# Patient Record
Sex: Male | Born: 1950
Health system: Southern US, Community
[De-identification: ages and names within clinical notes are randomized; demographics above are authoritative.]

## PROBLEM LIST (undated history)

## (undated) DIAGNOSIS — E785 Hyperlipidemia, unspecified: Secondary | ICD-10-CM

## (undated) DIAGNOSIS — E039 Hypothyroidism, unspecified: Secondary | ICD-10-CM

## (undated) DIAGNOSIS — I456 Pre-excitation syndrome: Secondary | ICD-10-CM

## (undated) DIAGNOSIS — N189 Chronic kidney disease, unspecified: Secondary | ICD-10-CM

## (undated) DIAGNOSIS — R55 Syncope and collapse: Secondary | ICD-10-CM

## (undated) DIAGNOSIS — K219 Gastro-esophageal reflux disease without esophagitis: Secondary | ICD-10-CM

## (undated) DIAGNOSIS — I4891 Unspecified atrial fibrillation: Secondary | ICD-10-CM

## (undated) HISTORY — DX: Syncope and collapse: R55

## (undated) HISTORY — DX: Gastro-esophageal reflux disease without esophagitis: K21.9

## (undated) HISTORY — DX: Chronic kidney disease, unspecified: N18.9

## (undated) HISTORY — PX: HERNIA REPAIR: SHX51

## (undated) HISTORY — DX: Hyperlipidemia, unspecified: E78.5

## (undated) HISTORY — DX: Unspecified atrial fibrillation: I48.91

## (undated) HISTORY — DX: Pre-excitation syndrome: I45.6

## (undated) HISTORY — DX: Hypothyroidism, unspecified: E03.9

## (undated) HISTORY — PX: ROTATOR CUFF REPAIR: SHX139

## (undated) HISTORY — PX: OTHER SURGICAL HISTORY: SHX169

---

## 2004-07-19 ENCOUNTER — Ambulatory Visit: Payer: Self-pay | Admitting: Internal Medicine

## 2004-07-22 ENCOUNTER — Ambulatory Visit: Payer: Self-pay | Admitting: Cardiology

## 2015-02-09 DIAGNOSIS — Z79899 Other long term (current) drug therapy: Secondary | ICD-10-CM

## 2015-02-09 DIAGNOSIS — I422 Other hypertrophic cardiomyopathy: Secondary | ICD-10-CM

## 2015-02-09 DIAGNOSIS — E785 Hyperlipidemia, unspecified: Secondary | ICD-10-CM | POA: Insufficient documentation

## 2015-02-09 DIAGNOSIS — I10 Essential (primary) hypertension: Secondary | ICD-10-CM

## 2015-02-09 DIAGNOSIS — I480 Paroxysmal atrial fibrillation: Secondary | ICD-10-CM

## 2015-02-09 HISTORY — DX: Other long term (current) drug therapy: Z79.899

## 2015-02-09 HISTORY — DX: Essential (primary) hypertension: I10

## 2015-02-09 HISTORY — DX: Paroxysmal atrial fibrillation: I48.0

## 2015-02-09 HISTORY — DX: Other hypertrophic cardiomyopathy: I42.2

## 2016-10-07 DIAGNOSIS — S0500XA Injury of conjunctiva and corneal abrasion without foreign body, unspecified eye, initial encounter: Secondary | ICD-10-CM | POA: Diagnosis not present

## 2017-09-05 DIAGNOSIS — Z79899 Other long term (current) drug therapy: Secondary | ICD-10-CM | POA: Diagnosis not present

## 2017-09-05 DIAGNOSIS — Z125 Encounter for screening for malignant neoplasm of prostate: Secondary | ICD-10-CM | POA: Diagnosis not present

## 2017-09-05 DIAGNOSIS — E039 Hypothyroidism, unspecified: Secondary | ICD-10-CM | POA: Diagnosis not present

## 2017-11-17 DIAGNOSIS — E039 Hypothyroidism, unspecified: Secondary | ICD-10-CM | POA: Diagnosis not present

## 2017-11-17 DIAGNOSIS — E782 Mixed hyperlipidemia: Secondary | ICD-10-CM | POA: Diagnosis not present

## 2017-11-17 DIAGNOSIS — J309 Allergic rhinitis, unspecified: Secondary | ICD-10-CM | POA: Diagnosis not present

## 2017-11-17 DIAGNOSIS — R972 Elevated prostate specific antigen [PSA]: Secondary | ICD-10-CM | POA: Diagnosis not present

## 2017-12-05 DIAGNOSIS — Z1339 Encounter for screening examination for other mental health and behavioral disorders: Secondary | ICD-10-CM | POA: Diagnosis not present

## 2017-12-05 DIAGNOSIS — E039 Hypothyroidism, unspecified: Secondary | ICD-10-CM | POA: Diagnosis not present

## 2017-12-05 DIAGNOSIS — J309 Allergic rhinitis, unspecified: Secondary | ICD-10-CM | POA: Diagnosis not present

## 2017-12-05 DIAGNOSIS — Z79899 Other long term (current) drug therapy: Secondary | ICD-10-CM | POA: Diagnosis not present

## 2017-12-05 DIAGNOSIS — R0982 Postnasal drip: Secondary | ICD-10-CM | POA: Diagnosis not present

## 2017-12-19 DIAGNOSIS — N401 Enlarged prostate with lower urinary tract symptoms: Secondary | ICD-10-CM | POA: Diagnosis not present

## 2017-12-19 DIAGNOSIS — R972 Elevated prostate specific antigen [PSA]: Secondary | ICD-10-CM | POA: Diagnosis not present

## 2017-12-27 DIAGNOSIS — N401 Enlarged prostate with lower urinary tract symptoms: Secondary | ICD-10-CM | POA: Diagnosis not present

## 2017-12-27 DIAGNOSIS — C61 Malignant neoplasm of prostate: Secondary | ICD-10-CM | POA: Diagnosis not present

## 2017-12-27 DIAGNOSIS — R972 Elevated prostate specific antigen [PSA]: Secondary | ICD-10-CM | POA: Diagnosis not present

## 2018-01-03 DIAGNOSIS — C61 Malignant neoplasm of prostate: Secondary | ICD-10-CM | POA: Diagnosis not present

## 2018-01-11 DIAGNOSIS — C61 Malignant neoplasm of prostate: Secondary | ICD-10-CM | POA: Diagnosis not present

## 2018-01-19 DIAGNOSIS — C61 Malignant neoplasm of prostate: Secondary | ICD-10-CM | POA: Diagnosis not present

## 2018-01-22 DIAGNOSIS — C61 Malignant neoplasm of prostate: Secondary | ICD-10-CM | POA: Diagnosis not present

## 2018-01-25 DIAGNOSIS — C61 Malignant neoplasm of prostate: Secondary | ICD-10-CM | POA: Diagnosis not present

## 2018-02-02 DIAGNOSIS — C61 Malignant neoplasm of prostate: Secondary | ICD-10-CM | POA: Diagnosis not present

## 2018-02-07 DIAGNOSIS — Z510 Encounter for antineoplastic radiation therapy: Secondary | ICD-10-CM | POA: Diagnosis not present

## 2018-02-07 DIAGNOSIS — C3431 Malignant neoplasm of lower lobe, right bronchus or lung: Secondary | ICD-10-CM | POA: Diagnosis not present

## 2018-02-07 DIAGNOSIS — C61 Malignant neoplasm of prostate: Secondary | ICD-10-CM | POA: Diagnosis not present

## 2018-02-20 DIAGNOSIS — C61 Malignant neoplasm of prostate: Secondary | ICD-10-CM | POA: Diagnosis not present

## 2018-02-20 DIAGNOSIS — Z510 Encounter for antineoplastic radiation therapy: Secondary | ICD-10-CM | POA: Diagnosis not present

## 2018-02-21 DIAGNOSIS — Z510 Encounter for antineoplastic radiation therapy: Secondary | ICD-10-CM | POA: Diagnosis not present

## 2018-02-21 DIAGNOSIS — C61 Malignant neoplasm of prostate: Secondary | ICD-10-CM | POA: Diagnosis not present

## 2018-02-26 DIAGNOSIS — I456 Pre-excitation syndrome: Secondary | ICD-10-CM | POA: Diagnosis not present

## 2018-02-26 DIAGNOSIS — R300 Dysuria: Secondary | ICD-10-CM | POA: Diagnosis not present

## 2018-02-26 DIAGNOSIS — Z510 Encounter for antineoplastic radiation therapy: Secondary | ICD-10-CM | POA: Diagnosis not present

## 2018-02-26 DIAGNOSIS — C61 Malignant neoplasm of prostate: Secondary | ICD-10-CM | POA: Diagnosis not present

## 2018-02-26 DIAGNOSIS — E785 Hyperlipidemia, unspecified: Secondary | ICD-10-CM | POA: Diagnosis not present

## 2018-02-27 DIAGNOSIS — Z510 Encounter for antineoplastic radiation therapy: Secondary | ICD-10-CM | POA: Diagnosis not present

## 2018-02-27 DIAGNOSIS — E785 Hyperlipidemia, unspecified: Secondary | ICD-10-CM | POA: Diagnosis not present

## 2018-02-27 DIAGNOSIS — I456 Pre-excitation syndrome: Secondary | ICD-10-CM | POA: Diagnosis not present

## 2018-02-27 DIAGNOSIS — C61 Malignant neoplasm of prostate: Secondary | ICD-10-CM | POA: Diagnosis not present

## 2018-02-27 DIAGNOSIS — R300 Dysuria: Secondary | ICD-10-CM | POA: Diagnosis not present

## 2018-02-28 DIAGNOSIS — R300 Dysuria: Secondary | ICD-10-CM | POA: Diagnosis not present

## 2018-02-28 DIAGNOSIS — C61 Malignant neoplasm of prostate: Secondary | ICD-10-CM | POA: Diagnosis not present

## 2018-02-28 DIAGNOSIS — Z510 Encounter for antineoplastic radiation therapy: Secondary | ICD-10-CM | POA: Diagnosis not present

## 2018-02-28 DIAGNOSIS — E785 Hyperlipidemia, unspecified: Secondary | ICD-10-CM | POA: Diagnosis not present

## 2018-02-28 DIAGNOSIS — I456 Pre-excitation syndrome: Secondary | ICD-10-CM | POA: Diagnosis not present

## 2018-03-01 DIAGNOSIS — R300 Dysuria: Secondary | ICD-10-CM | POA: Diagnosis not present

## 2018-03-01 DIAGNOSIS — E785 Hyperlipidemia, unspecified: Secondary | ICD-10-CM | POA: Diagnosis not present

## 2018-03-01 DIAGNOSIS — C61 Malignant neoplasm of prostate: Secondary | ICD-10-CM | POA: Diagnosis not present

## 2018-03-01 DIAGNOSIS — I456 Pre-excitation syndrome: Secondary | ICD-10-CM | POA: Diagnosis not present

## 2018-03-01 DIAGNOSIS — Z510 Encounter for antineoplastic radiation therapy: Secondary | ICD-10-CM | POA: Diagnosis not present

## 2018-03-02 DIAGNOSIS — R300 Dysuria: Secondary | ICD-10-CM | POA: Diagnosis not present

## 2018-03-02 DIAGNOSIS — I456 Pre-excitation syndrome: Secondary | ICD-10-CM | POA: Diagnosis not present

## 2018-03-02 DIAGNOSIS — Z510 Encounter for antineoplastic radiation therapy: Secondary | ICD-10-CM | POA: Diagnosis not present

## 2018-03-02 DIAGNOSIS — E785 Hyperlipidemia, unspecified: Secondary | ICD-10-CM | POA: Diagnosis not present

## 2018-03-02 DIAGNOSIS — C61 Malignant neoplasm of prostate: Secondary | ICD-10-CM | POA: Diagnosis not present

## 2018-03-05 DIAGNOSIS — I456 Pre-excitation syndrome: Secondary | ICD-10-CM | POA: Diagnosis not present

## 2018-03-05 DIAGNOSIS — R300 Dysuria: Secondary | ICD-10-CM | POA: Diagnosis not present

## 2018-03-05 DIAGNOSIS — Z510 Encounter for antineoplastic radiation therapy: Secondary | ICD-10-CM | POA: Diagnosis not present

## 2018-03-05 DIAGNOSIS — E785 Hyperlipidemia, unspecified: Secondary | ICD-10-CM | POA: Diagnosis not present

## 2018-03-05 DIAGNOSIS — C61 Malignant neoplasm of prostate: Secondary | ICD-10-CM | POA: Diagnosis not present

## 2018-03-06 DIAGNOSIS — I456 Pre-excitation syndrome: Secondary | ICD-10-CM | POA: Diagnosis not present

## 2018-03-06 DIAGNOSIS — E785 Hyperlipidemia, unspecified: Secondary | ICD-10-CM | POA: Diagnosis not present

## 2018-03-06 DIAGNOSIS — Z510 Encounter for antineoplastic radiation therapy: Secondary | ICD-10-CM | POA: Diagnosis not present

## 2018-03-06 DIAGNOSIS — R300 Dysuria: Secondary | ICD-10-CM | POA: Diagnosis not present

## 2018-03-06 DIAGNOSIS — C61 Malignant neoplasm of prostate: Secondary | ICD-10-CM | POA: Diagnosis not present

## 2018-03-07 DIAGNOSIS — E785 Hyperlipidemia, unspecified: Secondary | ICD-10-CM | POA: Diagnosis not present

## 2018-03-07 DIAGNOSIS — Z510 Encounter for antineoplastic radiation therapy: Secondary | ICD-10-CM | POA: Diagnosis not present

## 2018-03-07 DIAGNOSIS — R300 Dysuria: Secondary | ICD-10-CM | POA: Diagnosis not present

## 2018-03-07 DIAGNOSIS — C61 Malignant neoplasm of prostate: Secondary | ICD-10-CM | POA: Diagnosis not present

## 2018-03-07 DIAGNOSIS — I456 Pre-excitation syndrome: Secondary | ICD-10-CM | POA: Diagnosis not present

## 2018-03-08 DIAGNOSIS — E785 Hyperlipidemia, unspecified: Secondary | ICD-10-CM | POA: Diagnosis not present

## 2018-03-08 DIAGNOSIS — I456 Pre-excitation syndrome: Secondary | ICD-10-CM | POA: Diagnosis not present

## 2018-03-08 DIAGNOSIS — R300 Dysuria: Secondary | ICD-10-CM | POA: Diagnosis not present

## 2018-03-08 DIAGNOSIS — Z510 Encounter for antineoplastic radiation therapy: Secondary | ICD-10-CM | POA: Diagnosis not present

## 2018-03-08 DIAGNOSIS — C61 Malignant neoplasm of prostate: Secondary | ICD-10-CM | POA: Diagnosis not present

## 2018-03-09 DIAGNOSIS — C61 Malignant neoplasm of prostate: Secondary | ICD-10-CM | POA: Diagnosis not present

## 2018-03-09 DIAGNOSIS — I456 Pre-excitation syndrome: Secondary | ICD-10-CM | POA: Diagnosis not present

## 2018-03-09 DIAGNOSIS — R300 Dysuria: Secondary | ICD-10-CM | POA: Diagnosis not present

## 2018-03-09 DIAGNOSIS — E785 Hyperlipidemia, unspecified: Secondary | ICD-10-CM | POA: Diagnosis not present

## 2018-03-09 DIAGNOSIS — Z510 Encounter for antineoplastic radiation therapy: Secondary | ICD-10-CM | POA: Diagnosis not present

## 2018-03-12 DIAGNOSIS — C61 Malignant neoplasm of prostate: Secondary | ICD-10-CM | POA: Diagnosis not present

## 2018-03-12 DIAGNOSIS — E785 Hyperlipidemia, unspecified: Secondary | ICD-10-CM | POA: Diagnosis not present

## 2018-03-12 DIAGNOSIS — R300 Dysuria: Secondary | ICD-10-CM | POA: Diagnosis not present

## 2018-03-12 DIAGNOSIS — I456 Pre-excitation syndrome: Secondary | ICD-10-CM | POA: Diagnosis not present

## 2018-03-12 DIAGNOSIS — Z510 Encounter for antineoplastic radiation therapy: Secondary | ICD-10-CM | POA: Diagnosis not present

## 2018-03-14 DIAGNOSIS — R300 Dysuria: Secondary | ICD-10-CM | POA: Diagnosis not present

## 2018-03-14 DIAGNOSIS — E785 Hyperlipidemia, unspecified: Secondary | ICD-10-CM | POA: Diagnosis not present

## 2018-03-14 DIAGNOSIS — I456 Pre-excitation syndrome: Secondary | ICD-10-CM | POA: Diagnosis not present

## 2018-03-14 DIAGNOSIS — C61 Malignant neoplasm of prostate: Secondary | ICD-10-CM | POA: Diagnosis not present

## 2018-03-14 DIAGNOSIS — Z510 Encounter for antineoplastic radiation therapy: Secondary | ICD-10-CM | POA: Diagnosis not present

## 2018-03-15 DIAGNOSIS — Z510 Encounter for antineoplastic radiation therapy: Secondary | ICD-10-CM | POA: Diagnosis not present

## 2018-03-15 DIAGNOSIS — C61 Malignant neoplasm of prostate: Secondary | ICD-10-CM | POA: Diagnosis not present

## 2018-03-15 DIAGNOSIS — R300 Dysuria: Secondary | ICD-10-CM | POA: Diagnosis not present

## 2018-03-15 DIAGNOSIS — I456 Pre-excitation syndrome: Secondary | ICD-10-CM | POA: Diagnosis not present

## 2018-03-15 DIAGNOSIS — E785 Hyperlipidemia, unspecified: Secondary | ICD-10-CM | POA: Diagnosis not present

## 2018-03-16 DIAGNOSIS — E785 Hyperlipidemia, unspecified: Secondary | ICD-10-CM | POA: Diagnosis not present

## 2018-03-16 DIAGNOSIS — I456 Pre-excitation syndrome: Secondary | ICD-10-CM | POA: Diagnosis not present

## 2018-03-16 DIAGNOSIS — C61 Malignant neoplasm of prostate: Secondary | ICD-10-CM | POA: Diagnosis not present

## 2018-03-16 DIAGNOSIS — R300 Dysuria: Secondary | ICD-10-CM | POA: Diagnosis not present

## 2018-03-16 DIAGNOSIS — Z510 Encounter for antineoplastic radiation therapy: Secondary | ICD-10-CM | POA: Diagnosis not present

## 2018-03-19 DIAGNOSIS — C61 Malignant neoplasm of prostate: Secondary | ICD-10-CM | POA: Diagnosis not present

## 2018-03-19 DIAGNOSIS — I456 Pre-excitation syndrome: Secondary | ICD-10-CM | POA: Diagnosis not present

## 2018-03-19 DIAGNOSIS — Z510 Encounter for antineoplastic radiation therapy: Secondary | ICD-10-CM | POA: Diagnosis not present

## 2018-03-19 DIAGNOSIS — R300 Dysuria: Secondary | ICD-10-CM | POA: Diagnosis not present

## 2018-03-19 DIAGNOSIS — E785 Hyperlipidemia, unspecified: Secondary | ICD-10-CM | POA: Diagnosis not present

## 2018-03-20 DIAGNOSIS — Z510 Encounter for antineoplastic radiation therapy: Secondary | ICD-10-CM | POA: Diagnosis not present

## 2018-03-20 DIAGNOSIS — C61 Malignant neoplasm of prostate: Secondary | ICD-10-CM | POA: Diagnosis not present

## 2018-03-21 DIAGNOSIS — Z510 Encounter for antineoplastic radiation therapy: Secondary | ICD-10-CM | POA: Diagnosis not present

## 2018-03-21 DIAGNOSIS — C61 Malignant neoplasm of prostate: Secondary | ICD-10-CM | POA: Diagnosis not present

## 2018-03-22 DIAGNOSIS — C61 Malignant neoplasm of prostate: Secondary | ICD-10-CM | POA: Diagnosis not present

## 2018-03-22 DIAGNOSIS — Z510 Encounter for antineoplastic radiation therapy: Secondary | ICD-10-CM | POA: Diagnosis not present

## 2018-03-23 DIAGNOSIS — C61 Malignant neoplasm of prostate: Secondary | ICD-10-CM | POA: Diagnosis not present

## 2018-03-23 DIAGNOSIS — Z510 Encounter for antineoplastic radiation therapy: Secondary | ICD-10-CM | POA: Diagnosis not present

## 2018-03-26 DIAGNOSIS — C61 Malignant neoplasm of prostate: Secondary | ICD-10-CM | POA: Diagnosis not present

## 2018-03-26 DIAGNOSIS — Z510 Encounter for antineoplastic radiation therapy: Secondary | ICD-10-CM | POA: Diagnosis not present

## 2018-03-27 DIAGNOSIS — Z510 Encounter for antineoplastic radiation therapy: Secondary | ICD-10-CM | POA: Diagnosis not present

## 2018-03-27 DIAGNOSIS — C61 Malignant neoplasm of prostate: Secondary | ICD-10-CM | POA: Diagnosis not present

## 2018-03-28 DIAGNOSIS — Z510 Encounter for antineoplastic radiation therapy: Secondary | ICD-10-CM | POA: Diagnosis not present

## 2018-03-28 DIAGNOSIS — C61 Malignant neoplasm of prostate: Secondary | ICD-10-CM | POA: Diagnosis not present

## 2018-03-29 DIAGNOSIS — Z510 Encounter for antineoplastic radiation therapy: Secondary | ICD-10-CM | POA: Diagnosis not present

## 2018-03-29 DIAGNOSIS — C61 Malignant neoplasm of prostate: Secondary | ICD-10-CM | POA: Diagnosis not present

## 2018-03-30 DIAGNOSIS — Z510 Encounter for antineoplastic radiation therapy: Secondary | ICD-10-CM | POA: Diagnosis not present

## 2018-03-30 DIAGNOSIS — C61 Malignant neoplasm of prostate: Secondary | ICD-10-CM | POA: Diagnosis not present

## 2018-04-02 DIAGNOSIS — Z510 Encounter for antineoplastic radiation therapy: Secondary | ICD-10-CM | POA: Diagnosis not present

## 2018-04-02 DIAGNOSIS — C61 Malignant neoplasm of prostate: Secondary | ICD-10-CM | POA: Diagnosis not present

## 2018-04-03 DIAGNOSIS — C61 Malignant neoplasm of prostate: Secondary | ICD-10-CM | POA: Diagnosis not present

## 2018-04-03 DIAGNOSIS — Z510 Encounter for antineoplastic radiation therapy: Secondary | ICD-10-CM | POA: Diagnosis not present

## 2018-04-04 DIAGNOSIS — C61 Malignant neoplasm of prostate: Secondary | ICD-10-CM | POA: Diagnosis not present

## 2018-04-04 DIAGNOSIS — Z510 Encounter for antineoplastic radiation therapy: Secondary | ICD-10-CM | POA: Diagnosis not present

## 2018-04-05 DIAGNOSIS — Z510 Encounter for antineoplastic radiation therapy: Secondary | ICD-10-CM | POA: Diagnosis not present

## 2018-04-05 DIAGNOSIS — C61 Malignant neoplasm of prostate: Secondary | ICD-10-CM | POA: Diagnosis not present

## 2018-04-06 DIAGNOSIS — Z510 Encounter for antineoplastic radiation therapy: Secondary | ICD-10-CM | POA: Diagnosis not present

## 2018-04-06 DIAGNOSIS — C61 Malignant neoplasm of prostate: Secondary | ICD-10-CM | POA: Diagnosis not present

## 2018-04-09 DIAGNOSIS — C61 Malignant neoplasm of prostate: Secondary | ICD-10-CM | POA: Diagnosis not present

## 2018-04-09 DIAGNOSIS — Z510 Encounter for antineoplastic radiation therapy: Secondary | ICD-10-CM | POA: Diagnosis not present

## 2018-04-10 DIAGNOSIS — C61 Malignant neoplasm of prostate: Secondary | ICD-10-CM | POA: Diagnosis not present

## 2018-04-10 DIAGNOSIS — Z510 Encounter for antineoplastic radiation therapy: Secondary | ICD-10-CM | POA: Diagnosis not present

## 2018-04-11 DIAGNOSIS — Z510 Encounter for antineoplastic radiation therapy: Secondary | ICD-10-CM | POA: Diagnosis not present

## 2018-04-11 DIAGNOSIS — C61 Malignant neoplasm of prostate: Secondary | ICD-10-CM | POA: Diagnosis not present

## 2018-04-12 DIAGNOSIS — C61 Malignant neoplasm of prostate: Secondary | ICD-10-CM | POA: Diagnosis not present

## 2018-04-12 DIAGNOSIS — Z510 Encounter for antineoplastic radiation therapy: Secondary | ICD-10-CM | POA: Diagnosis not present

## 2018-04-13 DIAGNOSIS — C61 Malignant neoplasm of prostate: Secondary | ICD-10-CM | POA: Diagnosis not present

## 2018-04-13 DIAGNOSIS — Z510 Encounter for antineoplastic radiation therapy: Secondary | ICD-10-CM | POA: Diagnosis not present

## 2018-04-16 DIAGNOSIS — Z510 Encounter for antineoplastic radiation therapy: Secondary | ICD-10-CM | POA: Diagnosis not present

## 2018-04-16 DIAGNOSIS — Z0001 Encounter for general adult medical examination with abnormal findings: Secondary | ICD-10-CM | POA: Diagnosis not present

## 2018-04-16 DIAGNOSIS — C61 Malignant neoplasm of prostate: Secondary | ICD-10-CM | POA: Diagnosis not present

## 2018-04-17 DIAGNOSIS — Z510 Encounter for antineoplastic radiation therapy: Secondary | ICD-10-CM | POA: Diagnosis not present

## 2018-04-17 DIAGNOSIS — Z23 Encounter for immunization: Secondary | ICD-10-CM | POA: Diagnosis not present

## 2018-04-17 DIAGNOSIS — R6889 Other general symptoms and signs: Secondary | ICD-10-CM | POA: Diagnosis not present

## 2018-04-17 DIAGNOSIS — R0602 Shortness of breath: Secondary | ICD-10-CM | POA: Diagnosis not present

## 2018-04-17 DIAGNOSIS — Z2821 Immunization not carried out because of patient refusal: Secondary | ICD-10-CM | POA: Diagnosis not present

## 2018-04-17 DIAGNOSIS — C61 Malignant neoplasm of prostate: Secondary | ICD-10-CM | POA: Diagnosis not present

## 2018-04-17 DIAGNOSIS — R0789 Other chest pain: Secondary | ICD-10-CM | POA: Diagnosis not present

## 2018-04-18 ENCOUNTER — Ambulatory Visit (INDEPENDENT_AMBULATORY_CARE_PROVIDER_SITE_OTHER): Payer: Medicare Other | Admitting: Cardiology

## 2018-04-18 ENCOUNTER — Encounter: Payer: Self-pay | Admitting: Cardiology

## 2018-04-18 VITALS — BP 130/62 | HR 63 | Ht 70.0 in | Wt 195.6 lb

## 2018-04-18 DIAGNOSIS — Z9889 Other specified postprocedural states: Secondary | ICD-10-CM

## 2018-04-18 DIAGNOSIS — R6889 Other general symptoms and signs: Secondary | ICD-10-CM | POA: Diagnosis not present

## 2018-04-18 DIAGNOSIS — R0789 Other chest pain: Secondary | ICD-10-CM

## 2018-04-18 DIAGNOSIS — Z510 Encounter for antineoplastic radiation therapy: Secondary | ICD-10-CM | POA: Diagnosis not present

## 2018-04-18 DIAGNOSIS — Z8679 Personal history of other diseases of the circulatory system: Secondary | ICD-10-CM

## 2018-04-18 DIAGNOSIS — Z923 Personal history of irradiation: Secondary | ICD-10-CM

## 2018-04-18 DIAGNOSIS — C61 Malignant neoplasm of prostate: Secondary | ICD-10-CM

## 2018-04-18 DIAGNOSIS — Z79899 Other long term (current) drug therapy: Secondary | ICD-10-CM | POA: Diagnosis not present

## 2018-04-18 DIAGNOSIS — I480 Paroxysmal atrial fibrillation: Secondary | ICD-10-CM

## 2018-04-18 HISTORY — DX: Personal history of irradiation: Z92.3

## 2018-04-18 HISTORY — DX: Other chest pain: R07.89

## 2018-04-18 HISTORY — DX: Personal history of other diseases of the circulatory system: Z86.79

## 2018-04-18 HISTORY — DX: Malignant neoplasm of prostate: C61

## 2018-04-18 HISTORY — DX: Paroxysmal atrial fibrillation: I48.0

## 2018-04-18 NOTE — Patient Instructions (Signed)
Medication Instructions:  Your physician recommends that you continue on your current medications as directed. Please refer to the Current Medication list given to you today.  If you need a refill on your cardiac medications before your next appointment, please call your pharmacy.   Lab work: Your physician recommends that you return for lab work today: Troponin, TSH  If you have labs (blood work) drawn today and your tests are completely normal, you will receive your results only by: Marland Kitchen MyChart Message (if you have MyChart) OR . A paper copy in the mail If you have any lab test that is abnormal or we need to change your treatment, we will call you to review the results.  Testing/Procedures: Your physician has requested that you have an echocardiogram. Echocardiography is a painless test that uses sound waves to create images of your heart. It provides your doctor with information about the size and shape of your heart and how well your heart's chambers and valves are working. This procedure takes approximately one hour. There are no restrictions for this procedure.  Your physician has requested that you have en exercise stress myoview. For further information please visit [email or web address]. Please follow instruction sheet, as given.  Your physician has recommended that you wear a holter monitor. Holter monitors are medical devices that record the heart's electrical activity. Doctors most often use these monitors to diagnose arrhythmias. Arrhythmias are problems with the speed or rhythm of the heartbeat. The monitor is a small, portable device. You can wear one while you do your normal daily activities. This is usually used to diagnose what is causing palpitations/syncope (passing out). Wear for 48 hours      Follow-Up: At [facility], you and your health needs are our priority.  As part of our continuing mission to provide you with exceptional heart care, we have created designated  Provider Care Teams.  These Care Teams include your primary Cardiologist (physician) and Advanced Practice Providers (APPs -  Physician Assistants and Nurse Practitioners) who all work together to provide you with the care you need, when you need it. You will need a follow up appointment in 4 weeks.  Please call our office 2 months in advance to schedule this appointment.  You may see No primary care provider on file. or another member of our [employer] Provider Team in [place]: Shirlee More, MD . Jyl Heinz, MD  Any Other Special Instructions Will Be Listed Below (If Applicable).  Echocardiogram An echocardiogram, or echocardiography, uses sound waves (ultrasound) to produce an image of your heart. The echocardiogram is simple, painless, obtained within a short period of time, and offers valuable information to your health care provider. The images from an echocardiogram can provide information such as:  Evidence of coronary artery disease (CAD).  Heart size.  Heart muscle function.  Heart valve function.  Aneurysm detection.  Evidence of a past heart attack.  Fluid buildup around the heart.  Heart muscle thickening.  Assess heart valve function.  Tell a health care provider about:  Any allergies you have.  All medicines you are taking, including vitamins, herbs, eye drops, creams, and over-the-counter medicines.  Any problems you or family members have had with anesthetic medicines.  Any blood disorders you have.  Any surgeries you have had.  Any medical conditions you have.  Whether you are pregnant or may be pregnant. What happens before the procedure? No special preparation is needed. Eat and drink normally. What happens during the procedure?  In  order to produce an image of your heart, gel will be applied to your chest and a wand-like tool (transducer) will be moved over your chest. The gel will help transmit the sound waves from the transducer. The sound  waves will harmlessly bounce off your heart to allow the heart images to be captured in real-time motion. These images will then be recorded.  You may need an IV to receive a medicine that improves the quality of the pictures. What happens after the procedure? You may return to your normal schedule including diet, activities, and medicines, unless your health care provider tells you otherwise. This information is not intended to replace advice given to you by your health care provider. Make sure you discuss any questions you have with your health care provider. Document Released: 06/03/2000 Document Revised: 01/23/2016 Document Reviewed: 02/11/2013 Elsevier Interactive Patient Education  2017 [employer].  Cardiac Nuclear Scan A cardiac nuclear scan is a test that measures blood flow to the heart when a person is resting and when he or she is exercising. The test looks for problems such as:  Not enough blood reaching a portion of the heart.  The heart muscle not working normally.  You may need this test if:  You have heart disease.  You have had abnormal lab results.  You have had heart surgery or angioplasty.  You have chest pain.  You have shortness of breath.  In this test, a radioactive dye (tracer) is injected into your bloodstream. After the tracer has traveled to your heart, an imaging device is used to measure how much of the tracer is absorbed by or distributed to various areas of your heart. This procedure is usually done at a hospital and takes 2-4 hours. Tell a health care provider about:  Any allergies you have.  All medicines you are taking, including vitamins, herbs, eye drops, creams, and over-the-counter medicines.  Any problems you or family members have had with the use of anesthetic medicines.  Any blood disorders you have.  Any surgeries you have had.  Any medical conditions you have.  Whether you are pregnant or may be pregnant. What are the  risks? Generally, this is a safe procedure. However, problems may occur, including:  Serious chest pain and heart attack. This is only a risk if the stress portion of the test is done.  Rapid heartbeat.  Sensation of warmth in your chest. This usually passes quickly.  What happens before the procedure?  Ask your health care provider about changing or stopping your regular medicines. This is especially important if you are taking diabetes medicines or blood thinners.  Remove your jewelry on the day of the procedure. What happens during the procedure?  An IV tube will be inserted into one of your veins.  Your health care provider will inject a small amount of radioactive tracer through the tube.  You will wait for 20-40 minutes while the tracer travels through your bloodstream.  Your heart activity will be monitored with an electrocardiogram (ECG).  You will lie down on an exam table.  Images of your heart will be taken for about 15-20 minutes.  You may be asked to exercise on a treadmill or stationary bike. While you exercise, your heart's activity will be monitored with an ECG, and your blood pressure will be checked. If you are unable to exercise, you may be given a medicine to increase blood flow to parts of your heart.  When blood flow to your heart  has peaked, a tracer will again be injected through the IV tube.  After 20-40 minutes, you will get back on the exam table and have more images taken of your heart.  When the procedure is over, your IV tube will be removed. The procedure may vary among health care providers and hospitals. Depending on the type of tracer used, scans may need to be repeated 3-4 hours later. What happens after the procedure?  Unless your health care provider tells you otherwise, you may return to your normal schedule, including diet, activities, and medicines.  Unless your health care provider tells you otherwise, you may increase your fluid  intake. This will help flush the contrast dye from your body. Drink enough fluid to keep your urine clear or pale yellow.  It is up to you to get your test results. Ask your health care provider, or the department that is doing the test, when your results will be ready. Summary  A cardiac nuclear scan measures the blood flow to the heart when a person is resting and when he or she is exercising.  You may need this test if you are at risk for heart disease.  Tell your health care provider if you are pregnant.  Unless your health care provider tells you otherwise, increase your fluid intake. This will help flush the contrast dye from your body. Drink enough fluid to keep your urine clear or pale yellow. This information is not intended to replace advice given to you by your health care provider. Make sure you discuss any questions you have with your health care provider. Document Released: 07/01/2004 Document Revised: 06/08/2016 Document Reviewed: 05/15/2013 Elsevier Interactive Patient Education  2017 Elsevier Inc.    Holter Monitoring A Holter monitor is a small device that is used to detect abnormal heart rhythms. It clips to your clothing and is connected by wires to flat, sticky disks (electrodes) that attach to your chest. It is worn continuously for 24-48 hours. Follow these instructions at home:  Wear your Holter monitor at all times, even while exercising and sleeping, for as long as directed by your health care provider.  Make sure that the Holter monitor is safely clipped to your clothing or close to your body as recommended by your health care provider.  Do not get the monitor or wires wet.  Do not put body lotion or moisturizer on your chest.  Keep your skin clean.  Keep a diary of your daily activities, such as walking and doing chores. If you feel that your heartbeat is abnormal or that your heart is fluttering or skipping a beat: ? Record what you are doing when it  happens. ? Record what time of day the symptoms occur.  Return your Holter monitor as directed by your health care provider.  Keep all follow-up visits as directed by your health care provider. This is important. Get help right away if:  You feel lightheaded or you faint.  You have trouble breathing.  You feel pain in your chest, upper arm, or jaw.  You feel sick to your stomach and your skin is pale, cool, or damp.  You heartbeat feels unusual or abnormal. This information is not intended to replace advice given to you by your health care provider. Make sure you discuss any questions you have with your health care provider. Document Released: 03/04/2004 Document Revised: 11/12/2015 Document Reviewed: 01/13/2014 Elsevier Interactive Patient Education  [employer].

## 2018-04-18 NOTE — Progress Notes (Signed)
Cardiology Consultation:    Date:  04/18/2018   ID:  Ronald Stokes, DOB 1950-07-27, MRN [id number]  PCP:  Serita Grammes, MD  Cardiologist:  Jenne Campus, MD   Referring MD: No ref. provider found   Chief Complaint  Patient presents with  . Chest Pain  Am weak and tired  History of Present Illness:    Ronald Stokes is a 67 y.o. male who is being seen today for the evaluation of fatigue tiredness and chest pain which she at the request of No ref. provider found.  He does have diagnosis of apical hypertrophy that was established 3 years ago at the same time he was find to have paroxysmal atrial fibrillation after that he did have atrial fibrillation ablation since that time denies having any palpitations he still maintained on amiodarone to suppress his arrhythmia.  He was referred back to [place] because of complaint of being weak tired and fatigue he is being treated for prostate cancer with radiation therapy he got almost 30 therapy for his prostate.  For about 6 days he feels weak tired and very exhausted.  There is no passing out but dizziness and also unsteady gait is there.  Described to have chest pain this is a continuous sensation he wakes up with and goes to sleep with is been going on for last 6 days.  Today for the first day he does not have any pain.  There is no aggravating or relieving factors.  He thinks it may be when he lay down get slightly worse maybe when he eats he gets slightly worse but overall some mild pain 2-3 in scale up to 10.  Again today he does not have any pain.  On top of that he went to his primary care physician and he was find to be bradycardic with ventricular rate of 48. No past medical history on file.    Current Medications: Current Meds  Medication Sig  . amiodarone (PACERONE) 200 MG tablet Take 200 mg by mouth daily.  Marland Kitchen aspirin EC 81 MG tablet Take 1 tablet by mouth daily.  Marland Kitchen ezetimibe (ZETIA) 10 MG tablet Take 20 mg by mouth daily.  Marland Kitchen  levocetirizine (XYZAL) 5 MG tablet Take 5 mg by mouth at bedtime.  Marland Kitchen levothyroxine (SYNTHROID, LEVOTHROID) 112 MCG tablet Take 112 mcg by mouth daily.  . Multiple Vitamin (MULTIVITAMIN) capsule Take 1 capsule by mouth daily.  . phenazopyridine (PYRIDIUM) 200 MG tablet Take 1 tablet by mouth 3 (three) times daily.  . tamsulosin (FLOMAX) 0.4 MG CAPS capsule Take 0.4 mg by mouth daily.  . traZODone (DESYREL) 50 MG tablet Take 1 tablet by mouth at bedtime.     Allergies:   Patient has no known allergies.   Social History   Socioeconomic History  . Marital status: Single    Spouse name: Not on file  . Number of children: Not on file  . Years of education: Not on file  . Highest education level: Not on file  Occupational History  . Not on file  Social Needs  . Financial resource strain: Not on file  . Food insecurity:    Worry: Not on file    Inability: Not on file  . Transportation needs:    Medical: Not on file    Non-medical: Not on file  Tobacco Use  . Smoking status: Former Smoker    Types: Cigars  . Smokeless tobacco: Former Network engineer and Sexual Activity  . Alcohol use: Yes  Alcohol/week: 2.0 - 3.0 standard drinks    Types: 2 - 3 Cans of beer per week  . Drug use: Never  . Sexual activity: Not on file  Lifestyle  . Physical activity:    Days per week: Not on file    Minutes per session: Not on file  . Stress: Not on file  Relationships  . Social connections:    Talks on phone: Not on file    Gets together: Not on file    Attends religious service: Not on file    Active member of club or organization: Not on file    Attends meetings of clubs or organizations: Not on file    Relationship status: Not on file  Other Topics Concern  . Not on file  Social History Narrative  . Not on file     Family History: The patient's family history includes Heart disease in his father. ROS:   Please see the history of present illness.    All 14 point review of  systems negative except as described per history of present illness.  EKGs/Labs/Other Studies Reviewed:    The following studies were reviewed today:    Recent Labs: No results found for requested labs within last 8760 hours.  Recent Lipid Panel No results found for: CHOL, TRIG, HDL, CHOLHDL, VLDL, LDLCALC, LDLDIRECT  Physical Exam:    VS:  BP 130/62   Pulse 63   Ht 5\' 10"  (1.778 m)   Wt 195 lb 9.6 oz (88.7 kg)   SpO2 96%   BMI 28.07 kg/m     Wt Readings from Last 3 Encounters:  04/18/18 195 lb 9.6 oz (88.7 kg)     GEN:  Well nourished, well developed in no acute distress HEENT: Normal NECK: No JVD; No carotid bruits LYMPHATICS: No lymphadenopathy CARDIAC: RRR, no murmurs, no rubs, no gallops RESPIRATORY:  Clear to auscultation without rales, wheezing or rhonchi  ABDOMEN: Soft, non-tender, non-distended MUSCULOSKELETAL:  No edema; No deformity  SKIN: Warm and dry NEUROLOGIC:  Alert and oriented x 3 PSYCHIATRIC:  Normal affect   ASSESSMENT:    1. Atypical chest pain   2. Prostate cancer ([place])   3. Personal history of radiation therapy   4. History of hypertrophic cardiomyopathy   5. Paroxysmal atrial fibrillation ([facility])   6. Status post ablation of atrial fibrillation    PLAN:    In order of problems listed above:  1. Atypical chest pain.  I will repeat EKG today however again pain is gone today it lasted for 6 days continues I did I have low level suspicion this is coronary artery disease still I think it would be reasonable to perform stress test on him which will doing form of exercise Cardiolite. 2. History of hypertrophic cardia myopathy without obstruction on physical examination I cannot hear any murmur apparently previously he was diagnosed with apical hypertrophic cardiomyopathy.  Obviously will repeat echocardiogram to check left ventricular ejection fraction. 3. Paroxysmal atrial fibrillation.  He is not anticoagulated and I am very puzzled by that.  He  needs to be anticoagulated regardless we will see his chads 2 Vascor is a since he does have a history of cardiomyopathy which is hypertrophic.  I will repeat his echocardiogram to confirm this diagnosis if that is the case then we will reinitiate anticoagulation. 4. Status post atrial fibrillation ablation denies having any palpitations but I will ask him to wear monitor for 48 hours to see how slow his heart goes  and see if he get any recurrences of atrial fibrillation. 5. Sinus bradycardia for now we will continue present management but anticipate any future will be able to cut down his amiodarone may be completely discontinue.  On top of that since he takes amiodarone I will ask his primary care physician to send me a copy of his liver function test as well as thyroid.   Medication Adjustments/Labs and Tests Ordered: Current medicines are reviewed at length with the patient today.  Concerns regarding medicines are outlined above.  No orders of the defined types were placed in this encounter.  No orders of the defined types were placed in this encounter.   Signed, Park Liter, MD, [facility]. 04/18/2018 4:24 PM    [facility]

## 2018-04-19 ENCOUNTER — Telehealth: Payer: Self-pay | Admitting: Emergency Medicine

## 2018-04-19 DIAGNOSIS — Z510 Encounter for antineoplastic radiation therapy: Secondary | ICD-10-CM | POA: Diagnosis not present

## 2018-04-19 DIAGNOSIS — C61 Malignant neoplasm of prostate: Secondary | ICD-10-CM | POA: Diagnosis not present

## 2018-04-19 LAB — TSH: TSH: 5.9 u[IU]/mL — ABNORMAL HIGH (ref 0.450–4.500)

## 2018-04-19 LAB — TROPONIN I: Troponin I: <0.01 ng/mL (ref 0.00–0.04)

## 2018-04-19 NOTE — Telephone Encounter (Signed)
Informed wife of all upcoming appointments.

## 2018-04-20 DIAGNOSIS — C61 Malignant neoplasm of prostate: Secondary | ICD-10-CM | POA: Diagnosis not present

## 2018-04-20 DIAGNOSIS — Z510 Encounter for antineoplastic radiation therapy: Secondary | ICD-10-CM | POA: Diagnosis not present

## 2018-04-23 ENCOUNTER — Telehealth: Payer: Self-pay | Admitting: Emergency Medicine

## 2018-04-23 ENCOUNTER — Telehealth: Payer: Self-pay | Admitting: Cardiology

## 2018-04-23 DIAGNOSIS — C61 Malignant neoplasm of prostate: Secondary | ICD-10-CM | POA: Diagnosis not present

## 2018-04-23 MED ORDER — LEVOTHYROXINE SODIUM 125 MCG PO TABS: 125.0000 ug | ORAL_TABLET | Freq: Every day | ORAL | 1 refills | Status: DC

## 2018-04-23 NOTE — Telephone Encounter (Signed)
Patient called asking for his lab results

## 2018-04-23 NOTE — Telephone Encounter (Signed)
Left message for patient to return call.

## 2018-04-23 NOTE — Telephone Encounter (Signed)
Patient informed of lab results and to increase synthroid to 125 mcg daily. Also patient informed of upcoming appointments.

## 2018-04-24 DIAGNOSIS — Z510 Encounter for antineoplastic radiation therapy: Secondary | ICD-10-CM | POA: Diagnosis not present

## 2018-04-24 DIAGNOSIS — C61 Malignant neoplasm of prostate: Secondary | ICD-10-CM | POA: Diagnosis not present

## 2018-04-30 ENCOUNTER — Telehealth (HOSPITAL_COMMUNITY): Payer: Self-pay | Admitting: *Deleted

## 2018-04-30 NOTE — Telephone Encounter (Signed)
Left message on voicemail per DPR in reference to upcoming appointment scheduled on 04/1318 at 0945 with detailed instructions given per Myocardial Perfusion Study Information Sheet for the test. LM to arrive 15 minutes early, and that it is imperative to arrive on time for appointment to keep from having the test rescheduled. If you need to cancel or reschedule your appointment, please call the office within 24 hours of your appointment. Failure to do so may result in a cancellation of your appointment, and a $50 no show fee. Phone number given for call back for any questions. Aijah Lattner, Ranae Palms

## 2018-05-01 ENCOUNTER — Other Ambulatory Visit (HOSPITAL_COMMUNITY): Payer: Medicare Other

## 2018-05-02 ENCOUNTER — Ambulatory Visit (HOSPITAL_BASED_OUTPATIENT_CLINIC_OR_DEPARTMENT_OTHER): Payer: Medicare Other

## 2018-05-02 ENCOUNTER — Other Ambulatory Visit: Payer: Self-pay | Admitting: Cardiology

## 2018-05-02 ENCOUNTER — Ambulatory Visit (HOSPITAL_COMMUNITY): Payer: Medicare Other | Attending: Cardiovascular Disease

## 2018-05-02 ENCOUNTER — Ambulatory Visit (INDEPENDENT_AMBULATORY_CARE_PROVIDER_SITE_OTHER): Payer: Medicare Other

## 2018-05-02 VITALS — Ht 70.0 in | Wt 195.0 lb

## 2018-05-02 DIAGNOSIS — R0789 Other chest pain: Secondary | ICD-10-CM | POA: Insufficient documentation

## 2018-05-02 DIAGNOSIS — R5383 Other fatigue: Secondary | ICD-10-CM

## 2018-05-02 DIAGNOSIS — R001 Bradycardia, unspecified: Secondary | ICD-10-CM | POA: Diagnosis not present

## 2018-05-02 DIAGNOSIS — Z8679 Personal history of other diseases of the circulatory system: Secondary | ICD-10-CM

## 2018-05-02 DIAGNOSIS — R42 Dizziness and giddiness: Secondary | ICD-10-CM | POA: Diagnosis not present

## 2018-05-02 DIAGNOSIS — I480 Paroxysmal atrial fibrillation: Secondary | ICD-10-CM | POA: Diagnosis not present

## 2018-05-02 LAB — ECHOCARDIOGRAM COMPLETE
Height: 70 in
Weight: 3120 oz

## 2018-05-02 LAB — MYOCARDIAL PERFUSION IMAGING
LV dias vol: 114 mL (ref 62–150)
LV sys vol: 55 mL
Peak HR: 63 {beats}/min
Rest HR: 49 {beats}/min
SDS: 1
SRS: 0
SSS: 1
TID: 1.01

## 2018-05-02 MED ORDER — TECHNETIUM TC 99M TETROFOSMIN IV KIT
10.2000 | PACK | Freq: Once | INTRAVENOUS | Status: AC | PRN
  Administered 2018-05-02: 10.2 via INTRAVENOUS
  Filled 2018-05-02: qty 11

## 2018-05-02 MED ORDER — TECHNETIUM TC 99M TETROFOSMIN IV KIT
32.1000 | PACK | Freq: Once | INTRAVENOUS | Status: AC | PRN
  Administered 2018-05-02: 32.1 via INTRAVENOUS
  Filled 2018-05-02: qty 33

## 2018-05-02 MED ORDER — REGADENOSON 0.4 MG/5ML IV SOLN
0.4000 mg | Freq: Once | INTRAVENOUS | Status: AC
  Administered 2018-05-02: 0.4 mg via INTRAVENOUS

## 2018-05-14 ENCOUNTER — Telehealth: Payer: Self-pay | Admitting: Emergency Medicine

## 2018-05-14 NOTE — Telephone Encounter (Signed)
Left message to return call 

## 2018-05-23 DIAGNOSIS — C61 Malignant neoplasm of prostate: Secondary | ICD-10-CM | POA: Diagnosis not present

## 2018-06-22 DIAGNOSIS — C61 Malignant neoplasm of prostate: Secondary | ICD-10-CM | POA: Diagnosis not present

## 2018-08-17 DIAGNOSIS — F5102 Adjustment insomnia: Secondary | ICD-10-CM | POA: Diagnosis not present

## 2018-08-17 DIAGNOSIS — Z0000 Encounter for general adult medical examination without abnormal findings: Secondary | ICD-10-CM | POA: Diagnosis not present

## 2018-08-17 DIAGNOSIS — Z6826 Body mass index (BMI) 26.0-26.9, adult: Secondary | ICD-10-CM | POA: Diagnosis not present

## 2018-08-17 DIAGNOSIS — R001 Bradycardia, unspecified: Secondary | ICD-10-CM | POA: Diagnosis not present

## 2018-08-17 DIAGNOSIS — E039 Hypothyroidism, unspecified: Secondary | ICD-10-CM | POA: Diagnosis not present

## 2018-08-17 DIAGNOSIS — Z79899 Other long term (current) drug therapy: Secondary | ICD-10-CM | POA: Diagnosis not present

## 2018-08-17 DIAGNOSIS — C61 Malignant neoplasm of prostate: Secondary | ICD-10-CM | POA: Diagnosis not present

## 2018-08-17 DIAGNOSIS — E782 Mixed hyperlipidemia: Secondary | ICD-10-CM | POA: Diagnosis not present

## 2018-08-17 DIAGNOSIS — R1011 Right upper quadrant pain: Secondary | ICD-10-CM | POA: Diagnosis not present

## 2018-08-23 DIAGNOSIS — Z136 Encounter for screening for cardiovascular disorders: Secondary | ICD-10-CM | POA: Diagnosis not present

## 2018-08-23 DIAGNOSIS — Z87891 Personal history of nicotine dependence: Secondary | ICD-10-CM | POA: Diagnosis not present

## 2018-08-23 DIAGNOSIS — I700 Atherosclerosis of aorta: Secondary | ICD-10-CM | POA: Diagnosis not present

## 2018-08-23 DIAGNOSIS — R1011 Right upper quadrant pain: Secondary | ICD-10-CM | POA: Diagnosis not present

## 2018-08-24 DIAGNOSIS — R1011 Right upper quadrant pain: Secondary | ICD-10-CM | POA: Diagnosis not present

## 2018-08-24 DIAGNOSIS — K7689 Other specified diseases of liver: Secondary | ICD-10-CM | POA: Diagnosis not present

## 2018-11-16 DIAGNOSIS — S6992XA Unspecified injury of left wrist, hand and finger(s), initial encounter: Secondary | ICD-10-CM | POA: Diagnosis not present

## 2018-11-16 DIAGNOSIS — R3989 Other symptoms and signs involving the genitourinary system: Secondary | ICD-10-CM | POA: Diagnosis not present

## 2018-11-16 DIAGNOSIS — S0990XA Unspecified injury of head, initial encounter: Secondary | ICD-10-CM | POA: Diagnosis not present

## 2018-11-16 DIAGNOSIS — C61 Malignant neoplasm of prostate: Secondary | ICD-10-CM | POA: Diagnosis not present

## 2018-11-16 DIAGNOSIS — G250 Essential tremor: Secondary | ICD-10-CM | POA: Diagnosis not present

## 2018-11-16 DIAGNOSIS — G4700 Insomnia, unspecified: Secondary | ICD-10-CM | POA: Diagnosis not present

## 2018-11-21 DIAGNOSIS — C61 Malignant neoplasm of prostate: Secondary | ICD-10-CM | POA: Diagnosis not present

## 2018-11-30 DIAGNOSIS — K589 Irritable bowel syndrome without diarrhea: Secondary | ICD-10-CM | POA: Diagnosis not present

## 2018-11-30 DIAGNOSIS — Z6826 Body mass index (BMI) 26.0-26.9, adult: Secondary | ICD-10-CM | POA: Diagnosis not present

## 2018-11-30 DIAGNOSIS — S6992XD Unspecified injury of left wrist, hand and finger(s), subsequent encounter: Secondary | ICD-10-CM | POA: Diagnosis not present

## 2018-11-30 DIAGNOSIS — G4700 Insomnia, unspecified: Secondary | ICD-10-CM | POA: Diagnosis not present

## 2018-12-10 DIAGNOSIS — S5002XA Contusion of left elbow, initial encounter: Secondary | ICD-10-CM | POA: Diagnosis not present

## 2018-12-31 DIAGNOSIS — N3289 Other specified disorders of bladder: Secondary | ICD-10-CM | POA: Diagnosis not present

## 2018-12-31 DIAGNOSIS — C61 Malignant neoplasm of prostate: Secondary | ICD-10-CM | POA: Diagnosis not present

## 2019-01-10 DIAGNOSIS — K760 Fatty (change of) liver, not elsewhere classified: Secondary | ICD-10-CM | POA: Diagnosis not present

## 2019-01-10 DIAGNOSIS — E782 Mixed hyperlipidemia: Secondary | ICD-10-CM | POA: Diagnosis not present

## 2019-01-10 DIAGNOSIS — Z9181 History of falling: Secondary | ICD-10-CM | POA: Diagnosis not present

## 2019-01-10 DIAGNOSIS — Z79899 Other long term (current) drug therapy: Secondary | ICD-10-CM | POA: Diagnosis not present

## 2019-01-10 DIAGNOSIS — C61 Malignant neoplasm of prostate: Secondary | ICD-10-CM | POA: Diagnosis not present

## 2019-01-24 ENCOUNTER — Encounter: Payer: Self-pay | Admitting: Gastroenterology

## 2019-01-30 ENCOUNTER — Encounter: Payer: Self-pay | Admitting: Gastroenterology

## 2019-01-30 DIAGNOSIS — R351 Nocturia: Secondary | ICD-10-CM | POA: Diagnosis not present

## 2019-01-30 DIAGNOSIS — C61 Malignant neoplasm of prostate: Secondary | ICD-10-CM | POA: Diagnosis not present

## 2019-01-30 DIAGNOSIS — N3289 Other specified disorders of bladder: Secondary | ICD-10-CM | POA: Diagnosis not present

## 2019-02-13 ENCOUNTER — Encounter: Payer: Self-pay | Admitting: Gastroenterology

## 2019-02-13 ENCOUNTER — Other Ambulatory Visit: Payer: Self-pay

## 2019-02-13 ENCOUNTER — Ambulatory Visit (INDEPENDENT_AMBULATORY_CARE_PROVIDER_SITE_OTHER): Payer: Medicare Other | Admitting: Gastroenterology

## 2019-02-13 VITALS — BP 144/82 | HR 53 | Temp 97.7°F | Ht 70.0 in | Wt 187.5 lb

## 2019-02-13 DIAGNOSIS — R1084 Generalized abdominal pain: Secondary | ICD-10-CM

## 2019-02-13 DIAGNOSIS — R0789 Other chest pain: Secondary | ICD-10-CM | POA: Diagnosis not present

## 2019-02-13 MED ORDER — OMEPRAZOLE 20 MG PO CPDR: 20.0000 mg | DELAYED_RELEASE_CAPSULE | Freq: Every day | ORAL | 3 refills | Status: DC

## 2019-02-13 NOTE — Patient Instructions (Addendum)
If you are age 68 or older, your body mass index should be between 23-30. Your Body mass index is 26.9 kg/m. If this is out of the aforementioned range listed, please consider follow up with your Primary Care Provider.  If you are age 68 or younger, your body mass index should be between 19-25. Your Body mass index is 26.9 kg/m. If this is out of the aformentioned range listed, please consider follow up with your Primary Care Provider.   To help prevent the possible spread of infection to our patients, communities, and staff; we will be implementing the following measures:  As of now we are not allowing any visitors/family members to accompany you to any upcoming appointments with [facility]. If you have any concerns about this please contact our office to discuss prior to the appointment.   It has been recommended to you by your physician that you have an EGD/Colonoscopy completed after you have your Cardiac Clearance. Per your request, we did not schedule the procedure(s) today. Please contact our office at [phone] should you decide to have the procedure completed.   You have been [facility] [place] [address] [address] [facility]   You are scheduled on 02/18/2019 at 9:00am. You should arrive 15 minutes prior to your appointment time for registration. Please follow the written instructions below on the day of your exam:  WARNING: IF YOU ARE ALLERGIC TO IODINE/X-RAY DYE, PLEASE NOTIFY RADIOLOGY IMMEDIATELY AT [phone]! YOU WILL BE GIVEN A 13 HOUR PREMEDICATION PREP.  1) Do not eat or drink anything after 5:00am (4 hours prior to your test) 2) You have been given 2 bottles of oral contrast to drink. The solution may taste better if refrigerated, but do NOT add ice or any other liquid to this solution. Shake well before drinking.    Drink 1 bottle of contrast @ 7:00am (2 hours prior to  your exam)  Drink 1 bottle of contrast @ 8:00am (1 hour prior to your exam)  You may take any medications as prescribed with a small amount of water, if necessary. If you take any of the following medications: METFORMIN, GLUCOPHAGE, GLUCOVANCE, AVANDAMET, RIOMET, FORTAMET, [place] MET, JANUMET, GLUMETZA or METAGLIP, you MAY be asked to HOLD this medication 48 hours AFTER the exam.  The purpose of you drinking the oral contrast is to aid in the visualization of your intestinal tract. The contrast solution may cause some diarrhea. Depending on your individual set of symptoms, you may also receive an intravenous injection of x-ray contrast/dye. Plan on being at [facility] for 30 minutes or longer, depending on the type of exam you are having performed.  This test typically takes 30-45 minutes to complete.  If you have any questions regarding your exam or if you need to reschedule, you may call the CT department at [phone] between the hours of 8:00 am and 5:00 pm, Monday-Friday.  ________________________________________________________________________  Thank you,  Dr. Jackquline Denmark

## 2019-02-13 NOTE — Progress Notes (Signed)
Chief Complaint: Abdominal pain  Referring Provider:  Serita Grammes, MD      ASSESSMENT AND PLAN;   #1.  Generalized Abdo pain with abdominal bloating  #2.  Noncardiac chest pains/GERD.  #3. Chronic constipation.  #4.  H/O colonic polyps.  Plan: - Please obtain previous blood work and last clinic note from Serita Grammes MD at [facility]. - Proceed with CT scan abdo/pelvis with p.o. and IV contrast. - EGD and colon 2 day prep after cardio clearence (Dr Bettina Gavia).  I discussed risks and benefits in detail including risks of bleeding, perforation, aspiration.  Benefits were also discussed.  He wishes to proceed.  Consent forms were given. - Omeprazole 20mg  po qd #30,  - FU thereafter.  HPI:    Ronald Stokes is a 68 y.o. male  Very poor historian -Has been having generalized abdominal pain with abdominal bloating, at times radiating to the back.  Stabbing, associated with some nausea but no vomiting.  Gets worse after eating.  Has longstanding history of constipation which has been better lately. -Had chest pains with some right upper quadrant abdominal pain, evaluated by cardiology, atypical.  Could be related to GI problems as he does have longstanding history of heartburn.  No odynophagia or dysphagia.  Has been advised GI work-up. -No fever chills night sweats, no significant weight loss.  Has previously been diagnosed as having fatty liver.  No alcohol.  Has been on amiodarone for A. Fib.  Had blood work performed by Serita Grammes MD recently.  We do not have her records or blood work report.  Being followed by Dr. Bettina Gavia for cardiology.  No jaundice dark urine or pale stools.  Past GI work-up: -EGD 11/2014: Normal. Neg SB Bx -Colonoscopy 05/28/2014 ([facility])-poor preparation.  Colonic polyps status post polypectomy.  Moderate sigmoid diverticulosis.  Advised to get it repeated in 1 year.  But he did not despite letters. Bx- TA -CTA 11/17/2014: neg -[place]  11/17/2014- neg Past Medical History:  Diagnosis Date  . Atrial fibrillation ([place])   . CKD (chronic kidney disease)   . GERD (gastroesophageal reflux disease)   . Hyperlipidemia   . Hypothyroidism   . Syncope   . WPW (Wolff-Parkinson-White syndrome)     Past Surgical History:  Procedure Laterality Date  . COLONOSCOPY  05/28/2014   Colonic polyp status post polypectomy. Moderate sigmoid diverticulosis. Limited examination due to quality of preparation. \  . ESOPHAGOGASTRODUODENOSCOPY  12/18/2014   Normal EGD.   Marland Kitchen HERNIA REPAIR    . ROTATOR CUFF REPAIR    . WPW corrective surgery      Family History  Problem Relation Age of Onset  . Heart disease Father   . Colon cancer Neg Hx   . Esophageal cancer Neg Hx     Social History   Tobacco Use  . Smoking status: Former Smoker    Types: Cigars  . Smokeless tobacco: Former Network engineer Use Topics  . Alcohol use: Yes    Comment: ocassionally, beer once in a while  . Drug use: Never    Current Outpatient Medications  Medication Sig Dispense Refill  . amiodarone (PACERONE) 200 MG tablet Take 200 mg by mouth daily.  0  . aspirin EC 81 MG tablet Take 1 tablet by mouth as needed.     . dicyclomine (BENTYL) 20 MG tablet Take 20 mg by mouth daily.    Marland Kitchen levothyroxine (SYNTHROID) 112 MCG tablet Take 112 mcg by mouth daily  before breakfast.    . Multiple Vitamin (MULTIVITAMIN) capsule Take 1 capsule by mouth daily.    . tadalafil (CIALIS) 5 MG tablet Take 5 mg by mouth daily.    . tamsulosin (FLOMAX) 0.4 MG CAPS capsule Take 0.4 mg by mouth daily.  0  . zolpidem (AMBIEN) 5 MG tablet Take 2.5 mg by mouth daily.     No current facility-administered medications for this visit.     No Known Allergies  Review of Systems:  Constitutional: Denies fever, chills, diaphoresis, appetite change and fatigue.  HEENT: Denies photophobia, eye pain, redness, hearing loss, ear pain, congestion, sore throat, rhinorrhea, sneezing, mouth sores,  neck pain, neck stiffness and tinnitus.   Respiratory: Denies SOB, DOE, cough, chest tightness,  and wheezing.   Cardiovascular: Denies chest pain, palpitations and leg swelling.  Genitourinary: Denies dysuria, urgency, frequency, hematuria, flank pain and difficulty urinating.  Musculoskeletal: Denies myalgias, back pain, joint swelling, arthralgias and gait problem.  Skin: No rash.  Neurological: Denies dizziness, seizures, syncope, weakness, light-headedness, numbness and headaches.  Hematological: Denies adenopathy. Easy bruising, personal or family bleeding history  Psychiatric/Behavioral: Has anxiety or depression     Physical Exam:    BP (!) 144/82   Pulse (!) 53   Temp 97.7 F (36.5 C)   Ht 5\' 10"  (1.778 m)   Wt 187 lb 8 oz (85 kg)   BMI 26.90 kg/m  Filed Weights   02/13/19 1022  Weight: 187 lb 8 oz (85 kg)   Constitutional:  Well-developed, in no acute distress. Psychiatric: Normal mood and affect. Behavior is normal. HEENT: Pupils normal.  Conjunctivae are normal. No scleral icterus. Neck supple.  Cardiovascular: Normal rate, regular rhythm. No edema Pulmonary/chest: Effort normal and breath sounds normal. No wheezing, rales or rhonchi. Abdominal: Soft, nondistended. Nontender. Bowel sounds active throughout. There are no masses palpable. No hepatomegaly. Rectal:  defered Neurological: Alert and oriented to person place and time. Skin: Skin is warm and dry. No rashes noted.  Data Reviewed: I have personally reviewed following labs and imaging studies    Carmell Austria, MD 02/13/2019, 10:55 AM  Cc: Serita Grammes, MD

## 2019-02-18 ENCOUNTER — Other Ambulatory Visit: Payer: Self-pay

## 2019-02-18 ENCOUNTER — Encounter (HOSPITAL_BASED_OUTPATIENT_CLINIC_OR_DEPARTMENT_OTHER): Payer: Self-pay

## 2019-02-18 ENCOUNTER — Ambulatory Visit (HOSPITAL_BASED_OUTPATIENT_CLINIC_OR_DEPARTMENT_OTHER)
Admission: RE | Admit: 2019-02-18 | Discharge: 2019-02-18 | Disposition: A | Discharge status: Home / Self Care | Payer: Medicare Other | Source: Ambulatory Visit | Attending: Gastroenterology | Admitting: Gastroenterology

## 2019-02-18 ENCOUNTER — Other Ambulatory Visit (INDEPENDENT_AMBULATORY_CARE_PROVIDER_SITE_OTHER): Payer: Medicare Other

## 2019-02-18 DIAGNOSIS — R0789 Other chest pain: Secondary | ICD-10-CM

## 2019-02-18 DIAGNOSIS — R1084 Generalized abdominal pain: Secondary | ICD-10-CM | POA: Diagnosis not present

## 2019-02-18 DIAGNOSIS — Z8546 Personal history of malignant neoplasm of prostate: Secondary | ICD-10-CM | POA: Diagnosis not present

## 2019-02-18 DIAGNOSIS — N281 Cyst of kidney, acquired: Secondary | ICD-10-CM | POA: Diagnosis not present

## 2019-02-18 LAB — CREATININE, SERUM: Creatinine, Ser: 1.63 mg/dL — ABNORMAL HIGH (ref 0.40–1.50)

## 2019-02-18 LAB — BUN: BUN: 19 mg/dL (ref 6–23)

## 2019-02-18 MED ORDER — IOHEXOL 300 MG/ML  SOLN
100.0000 mL | Freq: Once | INTRAMUSCULAR | Status: AC | PRN
  Administered 2019-02-18: 80 mL via INTRAVENOUS

## 2019-02-18 NOTE — Progress Notes (Signed)
STAT labs ordered for patient to have CT completed;

## 2019-02-20 ENCOUNTER — Telehealth: Payer: Self-pay | Admitting: Gastroenterology

## 2019-02-20 NOTE — Telephone Encounter (Signed)
Pt returned your call regarding results, pls call him again.

## 2019-02-20 NOTE — Telephone Encounter (Signed)
Please see additional documentation concerning this patient 

## 2019-08-05 DIAGNOSIS — N3289 Other specified disorders of bladder: Secondary | ICD-10-CM | POA: Diagnosis not present

## 2019-08-05 DIAGNOSIS — C61 Malignant neoplasm of prostate: Secondary | ICD-10-CM | POA: Diagnosis not present

## 2019-10-07 DIAGNOSIS — E782 Mixed hyperlipidemia: Secondary | ICD-10-CM | POA: Diagnosis not present

## 2019-10-07 DIAGNOSIS — Z79899 Other long term (current) drug therapy: Secondary | ICD-10-CM | POA: Diagnosis not present

## 2019-10-07 DIAGNOSIS — Z0000 Encounter for general adult medical examination without abnormal findings: Secondary | ICD-10-CM | POA: Diagnosis not present

## 2019-10-07 DIAGNOSIS — I499 Cardiac arrhythmia, unspecified: Secondary | ICD-10-CM | POA: Diagnosis not present

## 2019-10-07 DIAGNOSIS — E039 Hypothyroidism, unspecified: Secondary | ICD-10-CM | POA: Diagnosis not present

## 2019-10-07 DIAGNOSIS — C61 Malignant neoplasm of prostate: Secondary | ICD-10-CM | POA: Diagnosis not present

## 2019-10-18 ENCOUNTER — Encounter: Payer: Self-pay | Admitting: General Practice

## 2019-11-14 IMAGING — CT CT ABD-PELV W/ CM
2 of 5 series · 15 of 46 positions shown, 17 images · IV contrast (APPLIED)
Comparison: Abdominopelvic CT 01/22/2018.

CLINICAL DATA: Umbilical pain extending into the groin with
occasional epigastric pain for 4-5 months. History of prostate
cancer with prostate radiation.

EXAM:
CT ABDOMEN AND PELVIS WITH CONTRAST
TECHNIQUE: Multidetector CT imaging of the abdomen and pelvis was performed
using the standard protocol following bolus administration of
intravenous contrast.
CONTRAST:  80mL OMNIPAQUE IOHEXOL 300 MG/ML  SOLN

[Series 2: axial st · axial · 0.86mm/px · z∈[-592,-82]mm · 12 of 114 slices shown, 14 images]
[im 6/114  soft-tissue]
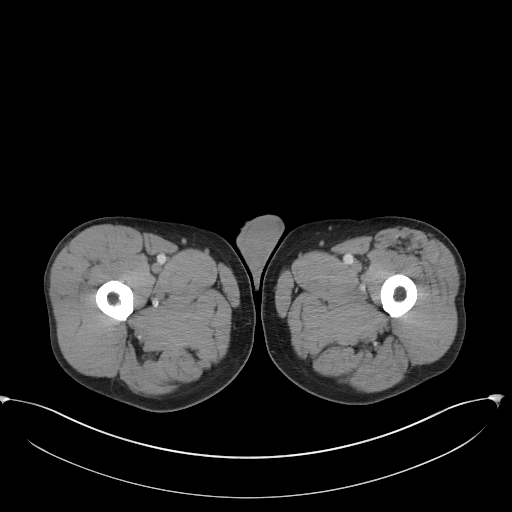
[im 6/114  bone]
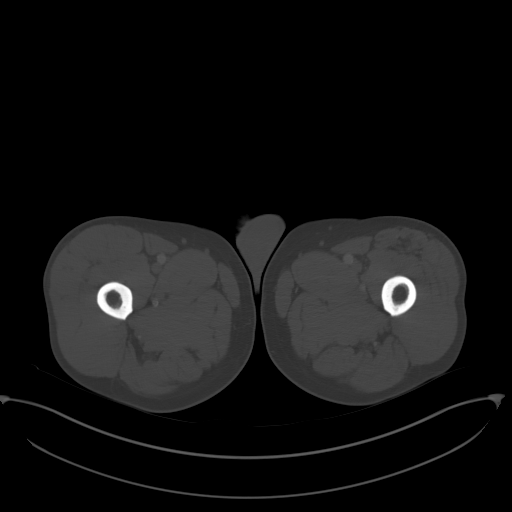
[im 18/114  soft-tissue]
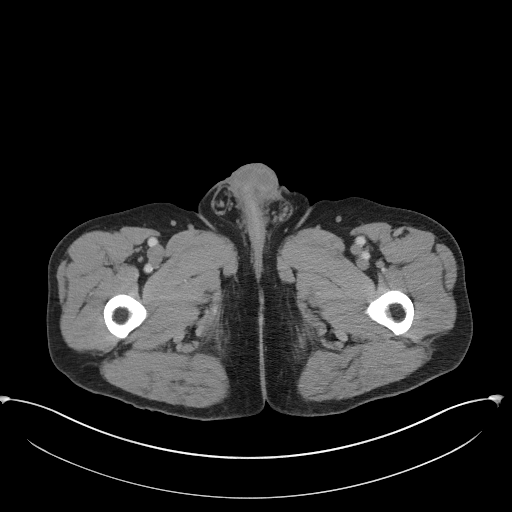
[im 24/114  soft-tissue]
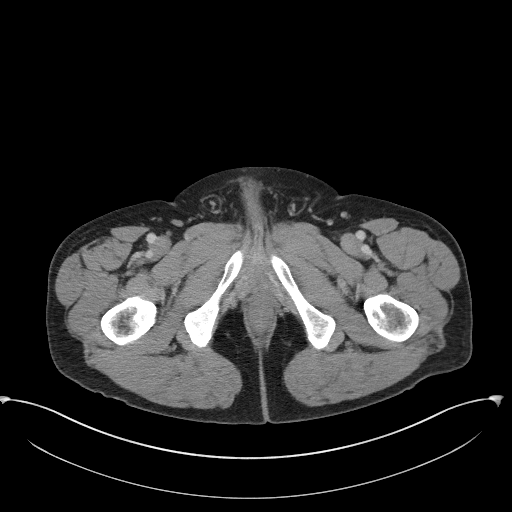
[im 36/114  soft-tissue]
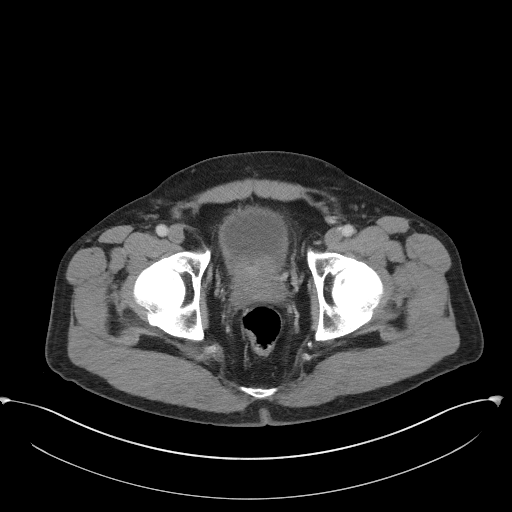
[im 42/114  soft-tissue]
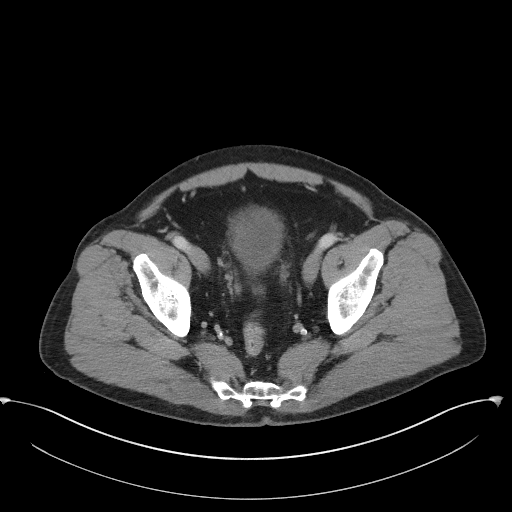
[im 54/114  soft-tissue]
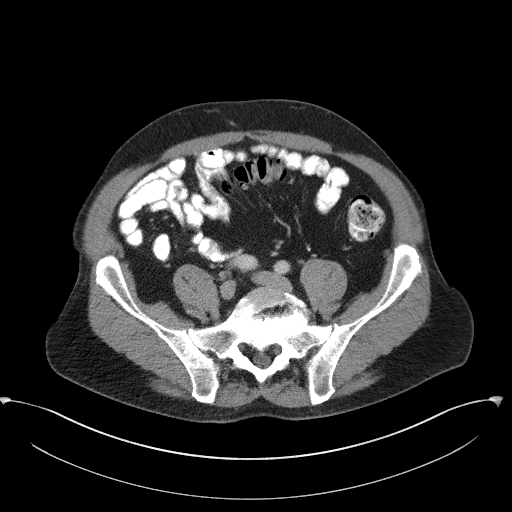
[im 60/114  soft-tissue]
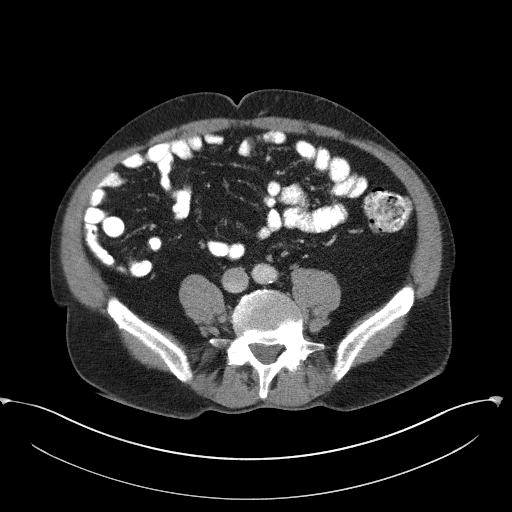
[im 72/114  soft-tissue]
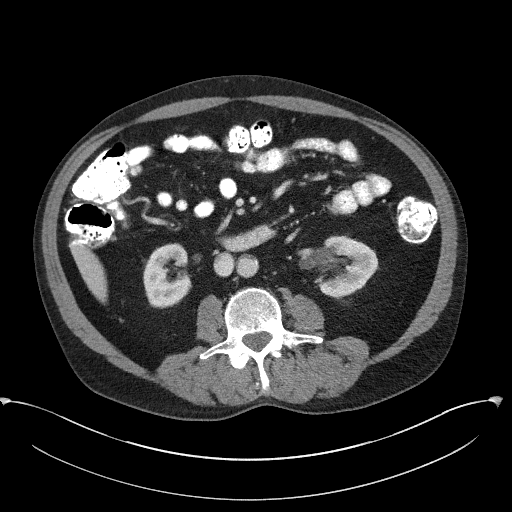
[im 78/114  soft-tissue]
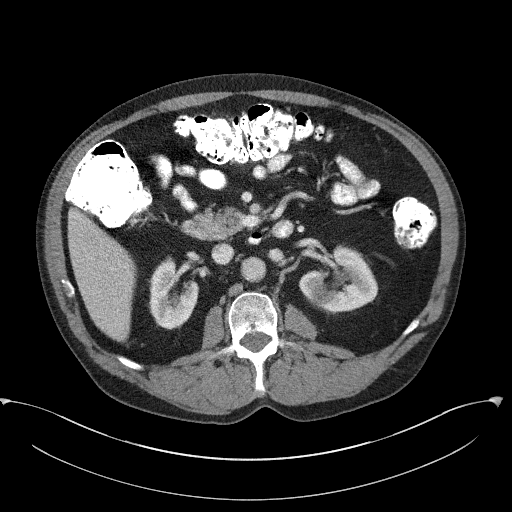
[im 78/114  bone]
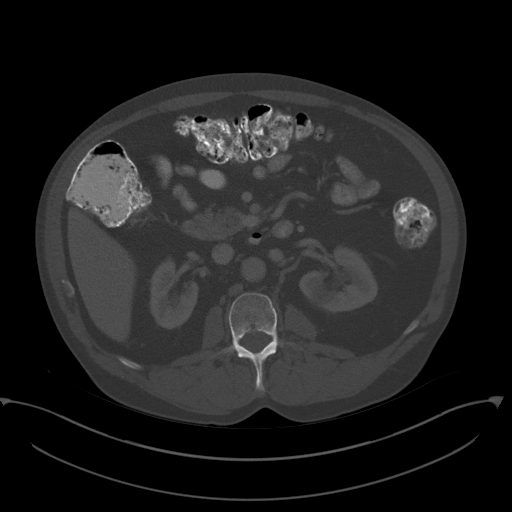
[im 90/114  soft-tissue]
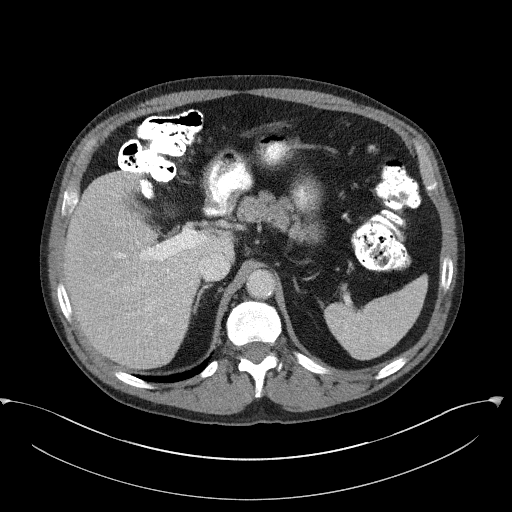
[im 96/114  soft-tissue]
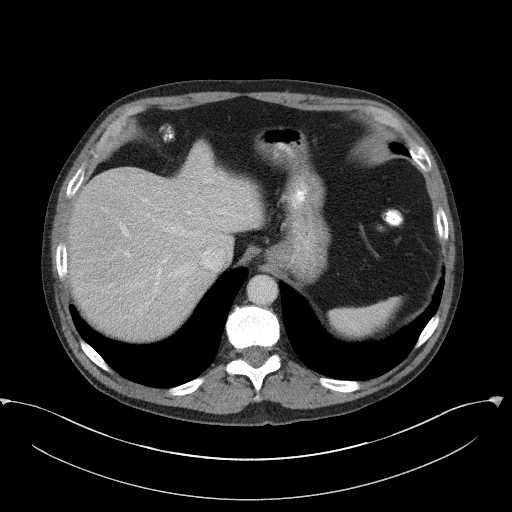
[im 108/114  soft-tissue]
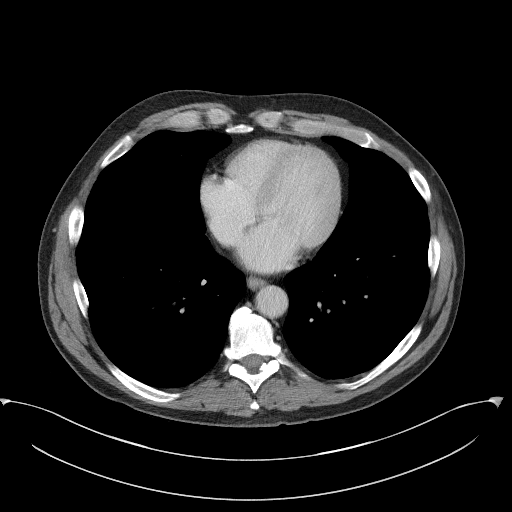

[Series 5: coronal st · coronal · 0.84mm/px · 3 of 107 slices shown]
[im 36/107  soft-tissue]
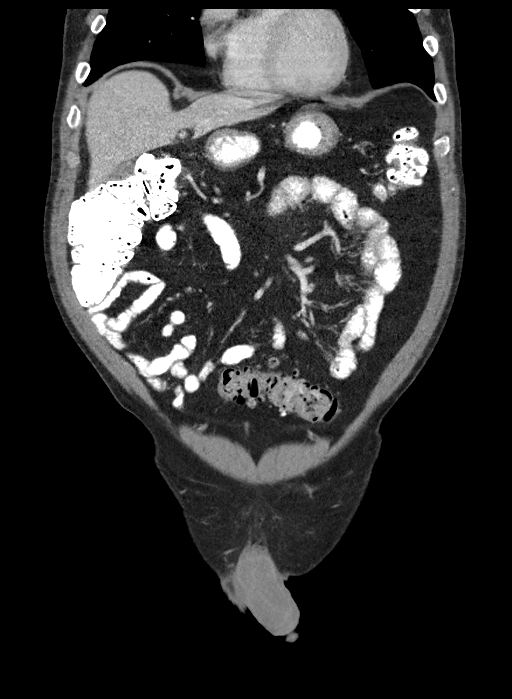
[im 48/107  soft-tissue]
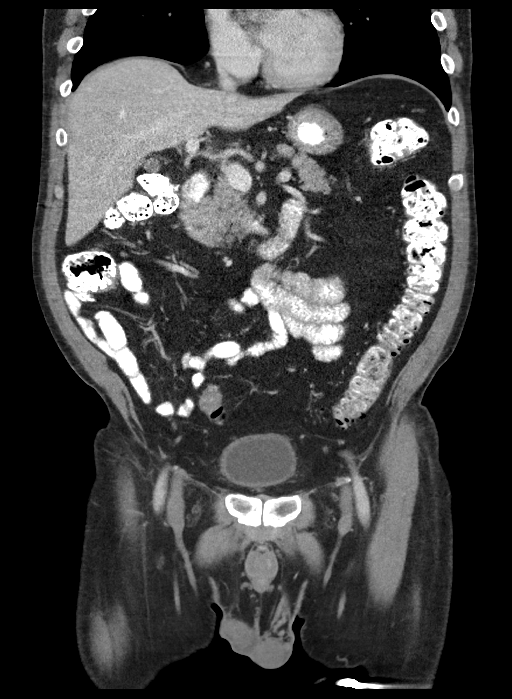
[im 59/107  soft-tissue]
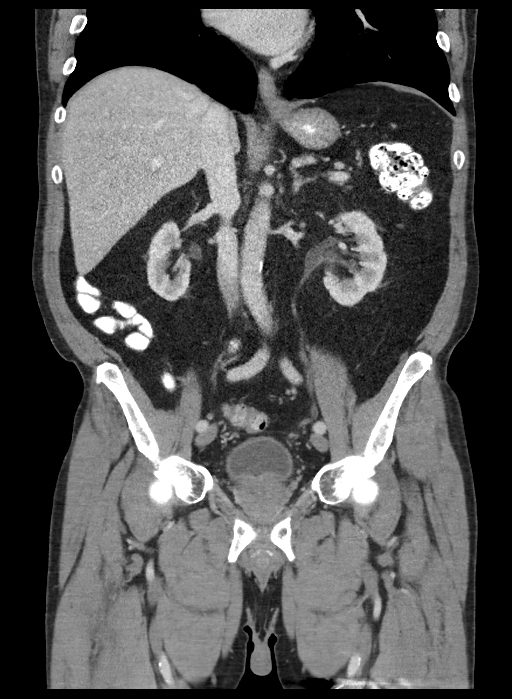

[15 of 46 positions shown; findings below may reference images not displayed]

FINDINGS: Lower chest: There are mild emphysematous changes at both lung bases
and mild atherosclerosis of the aorta and coronary arteries. No
significant pleural or pericardial effusion.

Hepatobiliary: The liver is normal in density without suspicious
focal abnormality. No evidence of gallstones, gallbladder wall
thickening or biliary dilatation.

Pancreas: Unremarkable. No pancreatic ductal dilatation or
surrounding inflammatory changes.

Spleen: Normal in size without focal abnormality.

Adrenals/Urinary Tract: Both adrenal glands appear normal. There is
a stable cyst in the upper pole of the left kidney. Small left renal
sinus cysts are also present. No evidence of enhancing renal mass,
urinary tract calculus or hydronephrosis.

Stomach/Bowel: No evidence of bowel wall thickening, distention or
surrounding inflammatory change. The appendix appears normal. There
are mild diverticular changes within the descending and sigmoid
colon.

Vascular/Lymphatic: There are no enlarged abdominal or pelvic lymph
nodes. There are stable small lymph nodes in the porta hepatis.
There is aortic and branch vessel atherosclerosis without acute
vascular findings.

Reproductive: Stable mild enlargement of the prostate gland without
focal abnormality.

Other: No evidence of abdominal wall mass or hernia. No ascites.

Musculoskeletal: No acute or significant osseous findings. Stable
advanced spondylosis at L5-S1 with a grade 1 retrolisthesis, annular
disc bulging and bilateral foraminal narrowing.
IMPRESSION: 1. No acute findings or explanation for the patient's symptoms.
2. Stable appearance of the prostate gland. No evidence of
metastatic disease.
3. Aortic Atherosclerosis (HEV8U-UZA.A) and Emphysema (HEV8U-3IA.T).
4. Advanced degenerative disc disease at L5-S1.

## 2020-01-06 DIAGNOSIS — I480 Paroxysmal atrial fibrillation: Secondary | ICD-10-CM | POA: Diagnosis not present

## 2020-01-06 DIAGNOSIS — E039 Hypothyroidism, unspecified: Secondary | ICD-10-CM | POA: Diagnosis not present

## 2020-01-06 DIAGNOSIS — E782 Mixed hyperlipidemia: Secondary | ICD-10-CM | POA: Diagnosis not present

## 2020-01-06 DIAGNOSIS — F5102 Adjustment insomnia: Secondary | ICD-10-CM | POA: Diagnosis not present

## 2020-01-06 DIAGNOSIS — C61 Malignant neoplasm of prostate: Secondary | ICD-10-CM | POA: Diagnosis not present

## 2020-01-06 DIAGNOSIS — Z79899 Other long term (current) drug therapy: Secondary | ICD-10-CM | POA: Diagnosis not present

## 2020-01-17 ENCOUNTER — Encounter: Payer: Self-pay | Admitting: General Practice

## 2020-04-13 DIAGNOSIS — K5732 Diverticulitis of large intestine without perforation or abscess without bleeding: Secondary | ICD-10-CM | POA: Diagnosis not present

## 2020-04-13 DIAGNOSIS — K5780 Diverticulitis of intestine, part unspecified, with perforation and abscess without bleeding: Secondary | ICD-10-CM | POA: Diagnosis not present

## 2020-05-08 ENCOUNTER — Telehealth: Payer: Self-pay

## 2020-05-08 ENCOUNTER — Encounter: Payer: Self-pay | Admitting: Gastroenterology

## 2020-05-08 ENCOUNTER — Ambulatory Visit (INDEPENDENT_AMBULATORY_CARE_PROVIDER_SITE_OTHER): Payer: Medicare Other | Admitting: Gastroenterology

## 2020-05-08 VITALS — BP 132/72 | HR 55 | Ht 70.0 in | Wt 175.0 lb

## 2020-05-08 DIAGNOSIS — Z8679 Personal history of other diseases of the circulatory system: Secondary | ICD-10-CM

## 2020-05-08 DIAGNOSIS — R109 Unspecified abdominal pain: Secondary | ICD-10-CM | POA: Diagnosis not present

## 2020-05-08 DIAGNOSIS — R634 Abnormal weight loss: Secondary | ICD-10-CM | POA: Diagnosis not present

## 2020-05-08 DIAGNOSIS — K5900 Constipation, unspecified: Secondary | ICD-10-CM | POA: Diagnosis not present

## 2020-05-08 DIAGNOSIS — I10 Essential (primary) hypertension: Secondary | ICD-10-CM

## 2020-05-08 DIAGNOSIS — Z86010 Personal history of colonic polyps: Secondary | ICD-10-CM | POA: Diagnosis not present

## 2020-05-08 DIAGNOSIS — I480 Paroxysmal atrial fibrillation: Secondary | ICD-10-CM

## 2020-05-08 MED ORDER — CLENPIQ 10-3.5-12 MG-GM -GM/160ML PO SOLN: 1.0000 | Freq: Once | ORAL | 0 refills | Status: AC

## 2020-05-08 NOTE — Telephone Encounter (Signed)
[facility] Pre-operative Risk Assessment     Request for surgical clearance:     Endoscopy Procedure  What type of surgery is being performed?     endoscospy/colonoscopy  When is this surgery scheduled?     07/01/2020  What type of clearance is required ?   Pharmacy  Are there any medications that need to be held prior to surgery and how long? Not on a blood thinner - just needs basic cardiac clearance giving him permission to have the procedures.  Practice name and name of physician performing surgery?      [facility]  What is your office phone and fax number?      Phone- [phone]  [phone]  Anesthesia type (None, local, MAC, general) ?       MAC

## 2020-05-08 NOTE — Telephone Encounter (Signed)
I have not seen this patient since 2019, and if we need to express our opinion about cardiac risk for endoscopy and the procedure I need to see him again

## 2020-05-08 NOTE — Telephone Encounter (Signed)
Ronald Stokes this was sent to me for advice guarding endoscopy. He sees you in the office.

## 2020-05-08 NOTE — Patient Instructions (Addendum)
Your provider has requested that you go to the basement level of the [facility] location for lab work. Press "B" on the elevator. The lab is located at the first door on the left as you exit the elevator.  Take 17gm of Miralax daily.  You have been scheduled for an endoscopy and colonoscopy. Please follow the written instructions given to you at your visit today. Please pick up your prep supplies at the pharmacy within the next 1-3 days. If you use inhalers (even only as needed), please bring them with you on the day of your procedure.

## 2020-05-08 NOTE — Progress Notes (Signed)
Chief Complaint: Abdominal pain  Referring Provider:  Serita Grammes, MD      ASSESSMENT AND PLAN;   #1. Generalized Abdo pain with bloating. Neg CT AP 01/2019 but wt loss is concening 20lb  #2.  Noncardiac chest pains/GERD.  #3. Chronic constipation.  #4.  H/O colonic polyps.  Plan: -CBC, CMP, TSH -Miralax 17g po qd. start today. -EGD and colon 2 day prep after cardio clearence (Dr Bettina Gavia) in Jan 2022.  I discussed risks and benefits in detail including risks of bleeding, perforation, aspiration.  Benefits were also discussed.  He wishes to proceed.  Consent forms were given. -CT report given to the patient -FU thereafter.  HPI:    Ronald Stokes is a 69 y.o. male  Very poor historian For follow-up visit.  He is taking care of his wife who has advanced dementia.  Continues to have generalized abdominal pain with abdominal bloating and back pain.  No significant nausea or vomiting.  His heartburn is much better.  He has stopped taking omeprazole on his own.  He does admit that his appetite is not very good.  He denies having history of early satiety.  He has lost 20 pounds over last 2 years.  Has been advised GI work-up.  Underwent CT Abdo/pelvis with p.o. and IV contrast 01/2019 which showed significant DJD of the back and no acute abdominal problems.   From previous notes: -Had chest pains with some right upper quadrant abdominal pain, evaluated by cardiology, atypical.  Could be related to GI problems as he does have longstanding history of heartburn.  No odynophagia or dysphagia.  Has been advised GI work-up. -No fever chills night sweats, no significant weight loss.  Has previously been diagnosed as having fatty liver.  No alcohol.  Has been on amiodarone for A. Fib.  Had blood work performed by Serita Grammes MD recently.  We do not have her records or blood work report.  Being followed by Dr. Bettina Gavia for cardiology.  No jaundice dark urine or pale  stools.  Wt Readings from Last 3 Encounters:  05/08/20 175 lb (79.4 kg)  02/13/19 187 lb 8 oz (85 kg)  05/02/18 195 lb (88.5 kg)     Past GI work-up: -EGD 11/2014: Normal. Neg SB Bx -Colonoscopy 05/28/2014 ([facility])-poor preparation.  Colonic polyps status post polypectomy.  Moderate sigmoid diverticulosis.  Advised to get it repeated in 1 year.  But he did not despite letters. Bx- TA -CTA 11/17/2014: neg -[place] 11/17/2014- neg  -CT 01/2019 1. No acute findings or explanation for the patient's symptoms. 2. Stable appearance of the prostate gland. No evidence of metastatic disease. 3. Aortic Atherosclerosis (ICD10-I70.0) and Emphysema (ICD10-J43.9). 4. Advanced degenerative disc disease at L5-S1. Past Medical History:  Diagnosis Date  . Atrial fibrillation ([place])   . CKD (chronic kidney disease)   . GERD (gastroesophageal reflux disease)   . Hyperlipidemia   . Hypothyroidism   . Syncope   . WPW (Wolff-Parkinson-White syndrome)     Past Surgical History:  Procedure Laterality Date  . COLONOSCOPY  05/28/2014   Colonic polyp status post polypectomy. Moderate sigmoid diverticulosis. Limited examination due to quality of preparation. \  . ESOPHAGOGASTRODUODENOSCOPY  12/18/2014   Normal EGD.   Marland Kitchen HERNIA REPAIR    . ROTATOR CUFF REPAIR    . WPW corrective surgery      Family History  Problem Relation Age of Onset  . Heart disease Father   . Colon cancer Neg Hx   .  Esophageal cancer Neg Hx     Social History   Tobacco Use  . Smoking status: Former Smoker    Types: Cigars  . Smokeless tobacco: Former Network engineer  . Vaping Use: Never used  Substance Use Topics  . Alcohol use: Yes    Comment: ocassionally, beer once in a while  . Drug use: Never    Current Outpatient Medications  Medication Sig Dispense Refill  . amiodarone (PACERONE) 200 MG tablet Take 200 mg by mouth daily.  0  . aspirin EC 81 MG tablet Take 1 tablet by mouth as needed.     . dicyclomine (BENTYL) 20  MG tablet Take 20 mg by mouth daily.    Marland Kitchen levothyroxine (SYNTHROID) 112 MCG tablet Take 112 mcg by mouth daily before breakfast.    . Multiple Vitamin (MULTIVITAMIN) capsule Take by mouth daily. Balance of Nature 8-10 tablets [place] 5 sips    . tadalafil (CIALIS) 5 MG tablet Take 5 mg by mouth daily.    . tamsulosin (FLOMAX) 0.4 MG CAPS capsule Take 0.4 mg by mouth daily.  0  . zolpidem (AMBIEN) 5 MG tablet Take 5 mg by mouth daily.     Marland Kitchen ezetimibe (ZETIA) 10 MG tablet Take 1 tablet by mouth daily. (Patient not taking: Reported on 05/08/2020)    . omeprazole (PRILOSEC) 20 MG capsule Take 1 capsule (20 mg total) by mouth daily. (Patient not taking: Reported on 05/08/2020) 90 capsule 3  . simvastatin (ZOCOR) 10 MG tablet Take 10 mg by mouth at bedtime.     No current facility-administered medications for this visit.    No Known Allergies  Review of Systems:  Constitutional: Denies fever, chills, diaphoresis, appetite change and fatigue.  HEENT: Denies photophobia, eye pain, redness, hearing loss, ear pain, congestion, sore throat, rhinorrhea, sneezing, mouth sores, neck pain, neck stiffness and tinnitus.   Respiratory: Denies SOB, DOE, cough, chest tightness,  and wheezing.   Cardiovascular: Denies chest pain, palpitations and leg swelling.  Genitourinary: Denies dysuria, urgency, frequency, hematuria, flank pain and difficulty urinating.  Musculoskeletal: Denies myalgias, back pain, joint swelling, arthralgias and gait problem.  Skin: No rash.  Neurological: Denies dizziness, seizures, syncope, weakness, light-headedness, numbness and headaches.  Hematological: Denies adenopathy. Easy bruising, personal or family bleeding history  Psychiatric/Behavioral: Has anxiety or depression     Physical Exam:    BP 132/72   Pulse (!) 55   Ht 5\' 10"  (1.778 m)   Wt 175 lb (79.4 kg)   BMI 25.11 kg/m  Filed Weights   05/08/20 1350  Weight: 175 lb (79.4 kg)   Constitutional:   Well-developed, in no acute distress. Psychiatric: Normal mood and affect. Behavior is normal. HEENT: Pupils normal.  Conjunctivae are normal. No scleral icterus. Neck supple.  Cardiovascular: Normal rate, regular rhythm. No edema Pulmonary/chest: Effort normal and breath sounds normal. No wheezing, rales or rhonchi. Abdominal: Soft, nondistended. Nontender. Bowel sounds active throughout. There are no masses palpable. No hepatomegaly. Rectal:  defered Neurological: Alert and oriented to person place and time. Skin: Skin is warm and dry. No rashes noted.  Data Reviewed: I have personally reviewed following labs and imaging studies    Carmell Austria, MD 05/08/2020, 2:20 PM  Cc: Serita Grammes, MD

## 2020-05-11 NOTE — Telephone Encounter (Signed)
Left message for patient to return call.

## 2020-05-13 ENCOUNTER — Telehealth: Payer: Self-pay | Admitting: Cardiology

## 2020-05-13 NOTE — Telephone Encounter (Signed)
Left message for patient to return call. Please see previous call. Patient needs to  be scheduled to be seen with Dr. Agustin Cree.

## 2020-05-13 NOTE — Telephone Encounter (Signed)
Left message for patient to return call.

## 2020-05-13 NOTE — Telephone Encounter (Signed)
Patient states he is returning a call. However, he is not sure who called or what the call may have been regarding.

## 2020-05-18 NOTE — Telephone Encounter (Signed)
Left message for patient to return call.

## 2020-05-20 NOTE — Telephone Encounter (Signed)
Patient has appointment to see Dr. Agustin Cree on 06/01/2020

## 2020-05-29 ENCOUNTER — Other Ambulatory Visit: Payer: Self-pay

## 2020-05-29 DIAGNOSIS — I456 Pre-excitation syndrome: Secondary | ICD-10-CM | POA: Insufficient documentation

## 2020-05-29 DIAGNOSIS — E039 Hypothyroidism, unspecified: Secondary | ICD-10-CM | POA: Insufficient documentation

## 2020-05-29 DIAGNOSIS — N189 Chronic kidney disease, unspecified: Secondary | ICD-10-CM | POA: Insufficient documentation

## 2020-05-29 DIAGNOSIS — K219 Gastro-esophageal reflux disease without esophagitis: Secondary | ICD-10-CM | POA: Insufficient documentation

## 2020-05-29 DIAGNOSIS — R55 Syncope and collapse: Secondary | ICD-10-CM | POA: Insufficient documentation

## 2020-06-01 ENCOUNTER — Encounter: Payer: Self-pay | Admitting: Cardiology

## 2020-06-01 ENCOUNTER — Telehealth: Payer: Self-pay | Admitting: Gastroenterology

## 2020-06-01 ENCOUNTER — Other Ambulatory Visit: Payer: Self-pay

## 2020-06-01 ENCOUNTER — Ambulatory Visit (INDEPENDENT_AMBULATORY_CARE_PROVIDER_SITE_OTHER): Payer: Medicare Other | Admitting: Cardiology

## 2020-06-01 VITALS — BP 140/82 | HR 63 | Ht 69.0 in | Wt 174.0 lb

## 2020-06-01 DIAGNOSIS — I422 Other hypertrophic cardiomyopathy: Secondary | ICD-10-CM

## 2020-06-01 DIAGNOSIS — R0789 Other chest pain: Secondary | ICD-10-CM | POA: Diagnosis not present

## 2020-06-01 DIAGNOSIS — I10 Essential (primary) hypertension: Secondary | ICD-10-CM

## 2020-06-01 DIAGNOSIS — R079 Chest pain, unspecified: Secondary | ICD-10-CM

## 2020-06-01 DIAGNOSIS — E039 Hypothyroidism, unspecified: Secondary | ICD-10-CM

## 2020-06-01 DIAGNOSIS — I480 Paroxysmal atrial fibrillation: Secondary | ICD-10-CM

## 2020-06-01 LAB — COMPREHENSIVE METABOLIC PANEL
ALT: 30 IU/L (ref 0–44)
AST: 28 IU/L (ref 0–40)
Albumin/Globulin Ratio: 1.9 (ref 1.2–2.2)
Albumin: 4.5 g/dL (ref 3.8–4.8)
Alkaline Phosphatase: 86 IU/L (ref 44–121)
BUN/Creatinine Ratio: 11 (ref 10–24)
BUN: 17 mg/dL (ref 8–27)
Bilirubin Total: 0.4 mg/dL (ref 0.0–1.2)
CO2: 22 mmol/L (ref 20–29)
Calcium: 9.3 mg/dL (ref 8.6–10.2)
Chloride: 105 mmol/L (ref 96–106)
Creatinine, Ser: 1.55 mg/dL — ABNORMAL HIGH (ref 0.76–1.27)
GFR calc Af Amer: 52 mL/min/{1.73_m2} — ABNORMAL LOW (ref >59)
GFR calc non Af Amer: 45 mL/min/{1.73_m2} — ABNORMAL LOW (ref >59)
Globulin, Total: 2.4 g/dL (ref 1.5–4.5)
Glucose: 88 mg/dL (ref 65–99)
Potassium: 5 mmol/L (ref 3.5–5.2)
Sodium: 141 mmol/L (ref 134–144)
Total Protein: 6.9 g/dL (ref 6.0–8.5)

## 2020-06-01 LAB — TSH: TSH: 1.5 u[IU]/mL (ref 0.450–4.500)

## 2020-06-01 NOTE — Patient Instructions (Signed)
Medication Instructions:  Your physician recommends that you continue on your current medications as directed. Please refer to the Current Medication list given to you today.  *If you need a refill on your cardiac medications before your next appointment, please call your pharmacy*   Lab Work: Your physician recommends that you return for lab work today: tsh, cmp   If you have labs (blood work) drawn today and your tests are completely normal, you will receive your results only by: Marland Kitchen MyChart Message (if you have MyChart) OR . A paper copy in the mail If you have any lab test that is abnormal or we need to change your treatment, we will call you to review the results.   Testing/Procedures: Your physician has requested that you have an echocardiogram. Echocardiography is a painless test that uses sound waves to create images of your heart. It provides your doctor with information about the size and shape of your heart and how well your heart's chambers and valves are working. This procedure takes approximately one hour. There are no restrictions for this procedure.    [facility] [address] [place], [place] [address] Phone:  [phone]    Please arrive 15 minutes prior to your appointment time for registration and insurance purposes.  The test will take approximately 3 to 4 hours to complete; you may bring reading material.  If someone comes with you to your appointment, they will need to remain in the main lobby due to limited space in the testing area. **If you are pregnant or breastfeeding, please notify the nuclear lab prior to your appointment**  How to prepare for your Myocardial Perfusion Test: . Do not eat or drink 3 hours prior to your test, except you may have water. . Do not consume products containing caffeine (regular or decaffeinated) 12 hours prior to your test. (ex: coffee, chocolate, sodas, tea). . Do bring a list of your current  medications with you.  If not listed below, you may take your medications as normal.  . Do wear comfortable clothes (no dresses or overalls) and walking shoes, tennis shoes preferred (No heels or open toe shoes are allowed). . Do NOT wear cologne, perfume, aftershave, or lotions (deodorant is allowed). . If these instructions are not followed, your test will have to be rescheduled.  Please report to [address] for your test.  If you have questions or concerns about your appointment, you can call the [facility] at [phone].  If you cannot keep your appointment, please provide 24 hours notification to the Nuclear Lab, to avoid a possible $50 charge to your account.    Follow-Up: At [facility], you and your health needs are our priority.  As part of our continuing mission to provide you with exceptional heart care, we have created designated Provider Care Teams.  These Care Teams include your primary Cardiologist (physician) and Advanced Practice Providers (APPs -  Physician Assistants and Nurse Practitioners) who all work together to provide you with the care you need, when you need it.  We recommend signing up for the patient portal called "MyChart".  Sign up information is provided on this After Visit Summary.  MyChart is used to connect with patients for Virtual Visits (Telemedicine).  Patients are able to view lab/test results, encounter notes, upcoming appointments, etc.  Non-urgent messages can be sent to your provider as well.   To learn more about what you can do with  MyChart, go to [email or web address].    Your next appointment:   6 week(s)  The format for your next appointment:   In Person  Provider:   Jenne Campus, MD   Other Instructions   Echocardiogram An echocardiogram is a procedure that uses painless sound waves (ultrasound) to produce an image of the heart. Images from an echocardiogram can provide important  information about:  Signs of coronary artery disease (CAD).  Aneurysm detection. An aneurysm is a weak or damaged part of an artery wall that bulges out from the normal force of blood pumping through the body.  Heart size and shape. Changes in the size or shape of the heart can be associated with certain conditions, including heart failure, aneurysm, and CAD.  Heart muscle function.  Heart valve function.  Signs of a past heart attack.  Fluid buildup around the heart.  Thickening of the heart muscle.  A tumor or infectious growth around the heart valves. Tell a health care provider about:  Any allergies you have.  All medicines you are taking, including vitamins, herbs, eye drops, creams, and over-the-counter medicines.  Any blood disorders you have.  Any surgeries you have had.  Any medical conditions you have.  Whether you are pregnant or may be pregnant. What are the risks? Generally, this is a safe procedure. However, problems may occur, including:  Allergic reaction to dye (contrast) that may be used during the procedure. What happens before the procedure? No specific preparation is needed. You may eat and drink normally. What happens during the procedure?   An IV tube may be inserted into one of your veins.  You may receive contrast through this tube. A contrast is an injection that improves the quality of the pictures from your heart.  A gel will be applied to your chest.  A wand-like tool (transducer) will be moved over your chest. The gel will help to transmit the sound waves from the transducer.  The sound waves will harmlessly bounce off of your heart to allow the heart images to be captured in real-time motion. The images will be recorded on a computer. The procedure may vary among health care providers and hospitals. What happens after the procedure?  You may return to your normal, everyday life, including diet, activities, and medicines, unless your  health care provider tells you not to do that. Summary  An echocardiogram is a procedure that uses painless sound waves (ultrasound) to produce an image of the heart.  Images from an echocardiogram can provide important information about the size and shape of your heart, heart muscle function, heart valve function, and fluid buildup around your heart.  You do not need to do anything to prepare before this procedure. You may eat and drink normally.  After the echocardiogram is completed, you may return to your normal, everyday life, unless your health care provider tells you not to do that. This information is not intended to replace advice given to you by your health care provider. Make sure you discuss any questions you have with your health care provider. Document Revised: 09/27/2018 Document Reviewed: 07/09/2016 Elsevier Patient Education  2020 [place].   Cardiac Nuclear Scan A cardiac nuclear scan is a test that measures blood flow to the heart when a person is resting and when he or she is exercising. The test looks for problems such as:  Not enough blood reaching a portion of the heart.  The heart muscle not working normally. You may need this  test if:  You have heart disease.  You have had abnormal lab results.  You have had heart surgery or a balloon procedure to open up blocked arteries (angioplasty).  You have chest pain.  You have shortness of breath. In this test, a radioactive dye (tracer) is injected into your bloodstream. After the tracer has traveled to your heart, an imaging device is used to measure how much of the tracer is absorbed by or distributed to various areas of your heart. This procedure is usually done at a hospital and takes 2-4 hours. Tell a health care provider about:  Any allergies you have.  All medicines you are taking, including vitamins, herbs, eye drops, creams, and over-the-counter medicines.  Any problems you or family members have  had with anesthetic medicines.  Any blood disorders you have.  Any surgeries you have had.  Any medical conditions you have.  Whether you are pregnant or may be pregnant. What are the risks? Generally, this is a safe procedure. However, problems may occur, including:  Serious chest pain and heart attack. This is only a risk if the stress portion of the test is done.  Rapid heartbeat.  Sensation of warmth in your chest. This usually passes quickly.  Allergic reaction to the tracer. What happens before the procedure?  Ask your health care provider about changing or stopping your regular medicines. This is especially important if you are taking diabetes medicines or blood thinners.  Follow instructions from your health care provider about eating or drinking restrictions.  Remove your jewelry on the day of the procedure. What happens during the procedure?  An IV will be inserted into one of your veins.  Your health care provider will inject a small amount of radioactive tracer through the IV.  You will wait for 20-40 minutes while the tracer travels through your bloodstream.  Your heart activity will be monitored with an electrocardiogram (ECG).  You will lie down on an exam table.  Images of your heart will be taken for about 15-20 minutes.  You may also have a stress test. For this test, one of the following may be done: ? You will exercise on a treadmill or stationary bike. While you exercise, your heart's activity will be monitored with an ECG, and your blood pressure will be checked. ? You will be given medicines that will increase blood flow to parts of your heart. This is done if you are unable to exercise.  When blood flow to your heart has peaked, a tracer will again be injected through the IV.  After 20-40 minutes, you will get back on the exam table and have more images taken of your heart.  Depending on the type of tracer used, scans may need to be repeated 3-4  hours later.  Your IV line will be removed when the procedure is over. The procedure may vary among health care providers and hospitals. What happens after the procedure?  Unless your health care provider tells you otherwise, you may return to your normal schedule, including diet, activities, and medicines.  Unless your health care provider tells you otherwise, you may increase your fluid intake. This will help to flush the contrast dye from your body. Drink enough fluid to keep your urine pale yellow.  Ask your health care provider, or the department that is doing the test: ? When will my results be ready? ? How will I get my results? Summary  A cardiac nuclear scan measures the blood flow to  the heart when a person is resting and when he or she is exercising.  Tell your health care provider if you are pregnant.  Before the procedure, ask your health care provider about changing or stopping your regular medicines. This is especially important if you are taking diabetes medicines or blood thinners.  After the procedure, unless your health care provider tells you otherwise, increase your fluid intake. This will help flush the contrast dye from your body.  After the procedure, unless your health care provider tells you otherwise, you may return to your normal schedule, including diet, activities, and medicines. This information is not intended to replace advice given to you by your health care provider. Make sure you discuss any questions you have with your health care provider. Document Revised: 11/20/2017 Document Reviewed: 11/20/2017 Elsevier Patient Education  [place].

## 2020-06-01 NOTE — Telephone Encounter (Signed)
Spoke to patient who had a few questions regarding his EGD/colonoscopy procedure scheduled for 07/01/20. All questions answered. Patient voiced understanding.

## 2020-06-01 NOTE — Progress Notes (Signed)
kg

## 2020-06-01 NOTE — Addendum Note (Signed)
Addended by: Senaida Ores on: 06/01/2020 11:03 AM   Modules accepted: Orders

## 2020-06-01 NOTE — Progress Notes (Signed)
Cardiology Office Note:    Date:  06/01/2020   ID:  Ronald Stokes, DOB 04/08/1951, MRN [id number]  PCP:  Serita Grammes, MD  Cardiologist:  Jenne Campus, MD    Referring MD: Serita Grammes, MD   Chief Complaint  Patient presents with  . Chest Pain    History of Present Illness:    Ronald Stokes is a 69 y.o. male with past medical history significant for apical form of hypertrophic cardiomyopathy.  That diagnosis has been established about 5 years ago, I did see him last time in 2019 when he was evaluated for this problem echocardiogram the time did not shows typical apical hypertrophy.  Another issue is the fact that he did have paroxysmal atrial fibrillation he did have atrial fibrillation ablation but many years ago since that time there was no palpitations.  He is still on amiodarone.  He disappeared from follow-up for last 2-1/2 years.  He does have a wife who is demented and he is struggling taking care of her.  She does not recognize him anymore.  He does have some home health coming and helping but overall situation is quite dramatic.  He is been complaining of having some epigastric/abdominal/chest pain for last few months.  He also lost about 20 to 30 pounds within the last few months he blames this on stress as well the fact that he does not eat well.  But he is scheduled to have gastroscopy and colonoscopy and that is how we were requested to clear him for that procedure and we asked him to come to our office to talk to [place]. Denies having any fatigue tiredness more than as expected in his situation.  Chest/epigastric pain that he describes not related to exercise.  Can last for few minutes.  Eating does not relieve the pain does not make pain worse.  Taking deep breath coughing does not change characteristic of the pain.  He did have a stress test done in 2019 which was negative.  Denies having a palpitations.  Past Medical History:  Diagnosis Date  . Apical variant  hypertrophic cardiomyopathy ([place]) 02/09/2015  . Atrial fibrillation ([place])   . Atypical chest pain 04/18/2018  . CKD (chronic kidney disease)   . Essential hypertension 02/09/2015  . GERD (gastroesophageal reflux disease)   . History of hypertrophic cardiomyopathy 04/18/2018  . Hyperlipidemia   . Hypothyroidism   . On amiodarone therapy 02/09/2015  . PAF (paroxysmal atrial fibrillation) ([place]) 02/09/2015  . Paroxysmal atrial fibrillation ([place]) 04/18/2018  . Personal history of radiation therapy 04/18/2018  . Prostate cancer ([place]) 04/18/2018  . Status post ablation of atrial fibrillation 04/18/2018  . Syncope   . WPW (Wolff-Parkinson-White syndrome)     Past Surgical History:  Procedure Laterality Date  . COLONOSCOPY  05/28/2014   Colonic polyp status post polypectomy. Moderate sigmoid diverticulosis. Limited examination due to quality of preparation. \  . ESOPHAGOGASTRODUODENOSCOPY  12/18/2014   Normal EGD.   Marland Kitchen HERNIA REPAIR    . ROTATOR CUFF REPAIR    . WPW corrective surgery      Current Medications: Current Meds  Medication Sig  . amiodarone (PACERONE) 200 MG tablet Take 200 mg by mouth daily.  Marland Kitchen ezetimibe (ZETIA) 10 MG tablet Take 1 tablet by mouth daily.  Marland Kitchen levothyroxine (SYNTHROID) 112 MCG tablet Take 112 mcg by mouth daily before breakfast.  . Multiple Vitamin (MULTIVITAMIN) capsule Take by mouth daily. Balance of Nature 8-10 tablets [place] 5 sips  Allergies:   Patient has no known allergies.   Social History   Socioeconomic History  . Marital status: Married    Spouse name: Not on file  . Number of children: Not on file  . Years of education: Not on file  . Highest education level: Not on file  Occupational History  . Not on file  Tobacco Use  . Smoking status: Former Smoker    Types: Cigars  . Smokeless tobacco: Former Network engineer  . Vaping Use: Never used  Substance and Sexual Activity  . Alcohol use: Yes    Comment: ocassionally, beer  once in a while  . Drug use: Never  . Sexual activity: Not on file  Other Topics Concern  . Not on file  Social History Narrative  . Not on file   Social Determinants of Health   Financial Resource Strain: Not on file  Food Insecurity: Not on file  Transportation Needs: Not on file  Physical Activity: Not on file  Stress: Not on file  Social Connections: Not on file     Family History: The patient's family history includes Heart disease in his father. There is no history of Colon cancer or Esophageal cancer. ROS:   Please see the history of present illness.    All 14 point review of systems negative except as described per history of present illness  EKGs/Labs/Other Studies Reviewed:      Recent Labs: No results found for requested labs within last 8760 hours.  Recent Lipid Panel No results found for: CHOL, TRIG, HDL, CHOLHDL, VLDL, LDLCALC, LDLDIRECT  Physical Exam:    VS:  BP 140/82 (BP Location: Left Arm, Patient Position: Sitting)   Pulse 63   Ht 5\' 9"  (1.753 m)   Wt 174 lb (78.9 kg)   SpO2 98%   BMI 25.70 kg/m     Wt Readings from Last 3 Encounters:  06/01/20 174 lb (78.9 kg)  05/08/20 175 lb (79.4 kg)  02/13/19 187 lb 8 oz (85 kg)     GEN:  Well nourished, well developed in no acute distress HEENT: Normal NECK: No JVD; No carotid bruits LYMPHATICS: No lymphadenopathy CARDIAC: RRR, no murmurs, no rubs, no gallops RESPIRATORY:  Clear to auscultation without rales, wheezing or rhonchi  ABDOMEN: Soft, non-tender, non-distended MUSCULOSKELETAL:  No edema; No deformity  SKIN: Warm and dry LOWER EXTREMITIES: no swelling NEUROLOGIC:  Alert and oriented x 3 PSYCHIATRIC:  Normal affect   ASSESSMENT:    1. Chest pain of uncertain etiology   2. Paroxysmal atrial fibrillation (HCC)   3. Apical variant hypertrophic cardiomyopathy ([place])   4. Essential hypertension   5. Acquired hypothyroidism   6. Atypical chest pain    PLAN:    In order of problems  listed above:  1. Chest pain uncertain etiology.  I will schedule him to have a stress test.  We will do Lexiscan. 2. History of paroxysmal atrial fibrillation denies have any palpitations.  Last monitor reviewed did not show any evidence of atrial fibrillation.  We will continue monitoring.  We will try to confirm the diagnosis of hypertrophic cardiomyopathy since that still critical for potential anticoagulation. 3. Apical variant hypertrophic cardiomyopathy, echocardiogram will repeat it if echocardiogram will be unrevealing we will proceed with MRI. 4. Essential hypertension: Blood pressure seems to be well controlled continue present management. 5. Acquired hypothyroidism no recent test done I will check to test today. 6. Dizziness he described dizziness when he gets up very  quickly.  He does not have dizziness when he sitting or when he is walking.  In the future we will consider a monitor to make sure he does not have any more worsening bradycardia.  EKG today showed heart rate of 53. 7. High risk medication use: He is taking amiodarone, will check his TSH as well as liver function test today.   Medication Adjustments/Labs and Tests Ordered: Current medicines are reviewed at length with the patient today.  Concerns regarding medicines are outlined above.  Orders Placed This Encounter  Procedures  . EKG 12-Lead   Medication changes: No orders of the defined types were placed in this encounter.   Signed, Park Liter, MD, [facility] 06/01/2020 10:51 AM    [place]

## 2020-06-04 NOTE — Telephone Encounter (Signed)
Can you schedule him to have echocardiogram before the colonoscopy please

## 2020-06-04 NOTE — Telephone Encounter (Signed)
I was following up to see if this patient had gotten anti-coag clearance after he was seen this week and it looks like his scheduled for an echo in January.  His procedure is scheduled for 1/12 -do I need to move his procedure until after his echo?

## 2020-06-05 NOTE — Telephone Encounter (Signed)
Order placed will inform scheduler.

## 2020-06-08 ENCOUNTER — Telehealth: Payer: Self-pay | Admitting: Cardiology

## 2020-06-08 NOTE — Telephone Encounter (Signed)
Pt has questions regarding when and where to get his covid test before his procedure (PROPOFOL ENDO COL [1273]) on January 12th. Please advise  Thanks, kbl

## 2020-06-08 NOTE — Telephone Encounter (Signed)
Called patient. He is wanting to know where to get a covid test for his colonoscopy, I advised him we normally send to [facility] but he needs to check with gi office that has ordered colonoscopy. He verbally understood no further questions.

## 2020-06-09 NOTE — Addendum Note (Signed)
Addended by: Jenne Campus on: 06/09/2020 09:02 AM   Modules accepted: Orders

## 2020-06-10 NOTE — Telephone Encounter (Signed)
Patient notified of results and verbalized understanding.  

## 2020-06-10 NOTE — Telephone Encounter (Signed)
-----   Message from Park Liter, MD sent at 06/02/2020 10:55 AM EST ----- All labs are good, thyroid function normal.

## 2020-06-24 ENCOUNTER — Telehealth: Payer: Self-pay | Admitting: *Deleted

## 2020-06-24 NOTE — Telephone Encounter (Signed)
Left message on voicemail per DPR in reference to upcoming appointment scheduled on 07/02/20 at [facility] with detailed instructions given per Myocardial Perfusion Study Information Sheet for the test. LM to arrive 15 minutes early, and that it is imperative to arrive on time for appointment to keep from having the test rescheduled. If you need to cancel or reschedule your appointment, please call the office within 24 hours of your appointment. Failure to do so may result in a cancellation of your appointment, and a $50 no show fee. Phone number given for call back for any questions. No mychart available.Beatrix Breece, Ranae Palms

## 2020-06-29 ENCOUNTER — Other Ambulatory Visit: Payer: Medicare Other

## 2020-06-29 ENCOUNTER — Other Ambulatory Visit: Payer: Self-pay | Admitting: Gastroenterology

## 2020-06-30 ENCOUNTER — Telehealth: Payer: Self-pay

## 2020-06-30 LAB — SARS CORONAVIRUS 2 (TAT 6-24 HRS): SARS Coronavirus 2: NEGATIVE

## 2020-06-30 NOTE — Telephone Encounter (Signed)
Patient has been notified through voicemail on 06/29/20 and 06/30/20 that his 05/01/21 procedure will be postponed due to cardiac issues. Have not heard back from patient to confirm he received the messages.

## 2020-06-30 NOTE — Telephone Encounter (Signed)
-----   Message from Jackquline Denmark, MD sent at 06/29/2020  2:49 PM EST ----- Regarding: RE: [place] pt Thanks for letting me know Will forward it to [facility] as well  RG  ----- Message ----- From: Osvaldo Angst, CRNA Sent: 06/26/2020   6:28 AM EST To: Doran Stabler, MD, Jackquline Denmark, MD Subject: [facility] pt                                         Dr. Lyndel Safe,  This pt is undergoing a w/u for chest pain of unknown etiology.  We will need to postpone his GI procedure scheduled on 1/12 until [facility]DS has completed this w/u.  Thanks,  Osvaldo Angst

## 2020-07-01 ENCOUNTER — Encounter: Payer: Medicare Other | Admitting: Gastroenterology

## 2020-07-02 ENCOUNTER — Ambulatory Visit (INDEPENDENT_AMBULATORY_CARE_PROVIDER_SITE_OTHER): Payer: Medicare Other

## 2020-07-02 ENCOUNTER — Other Ambulatory Visit: Payer: Self-pay

## 2020-07-02 DIAGNOSIS — R079 Chest pain, unspecified: Secondary | ICD-10-CM | POA: Diagnosis not present

## 2020-07-02 DIAGNOSIS — E039 Hypothyroidism, unspecified: Secondary | ICD-10-CM

## 2020-07-02 DIAGNOSIS — I422 Other hypertrophic cardiomyopathy: Secondary | ICD-10-CM | POA: Diagnosis not present

## 2020-07-02 DIAGNOSIS — I480 Paroxysmal atrial fibrillation: Secondary | ICD-10-CM

## 2020-07-02 DIAGNOSIS — R0789 Other chest pain: Secondary | ICD-10-CM

## 2020-07-02 DIAGNOSIS — I10 Essential (primary) hypertension: Secondary | ICD-10-CM

## 2020-07-02 LAB — MYOCARDIAL PERFUSION IMAGING
LV dias vol: 120 mL (ref 62–150)
LV sys vol: 55 mL
Peak HR: 61 {beats}/min
Rest HR: 48 {beats}/min
SDS: 0
SRS: 9
SSS: 9
TID: 1.13

## 2020-07-02 MED ORDER — TECHNETIUM TC 99M TETROFOSMIN IV KIT
11.0000 | PACK | Freq: Once | INTRAVENOUS | Status: AC | PRN
  Administered 2020-07-02: 11 via INTRAVENOUS

## 2020-07-02 MED ORDER — REGADENOSON 0.4 MG/5ML IV SOLN
0.4000 mg | Freq: Once | INTRAVENOUS | Status: AC
  Administered 2020-07-02: 0.4 mg via INTRAVENOUS

## 2020-07-02 MED ORDER — TECHNETIUM TC 99M TETROFOSMIN IV KIT
32.4000 | PACK | Freq: Once | INTRAVENOUS | Status: AC | PRN
  Administered 2020-07-02: 32.4 via INTRAVENOUS

## 2020-07-03 ENCOUNTER — Telehealth: Payer: Self-pay

## 2020-07-03 ENCOUNTER — Telehealth: Payer: Self-pay | Admitting: Cardiology

## 2020-07-03 NOTE — Telephone Encounter (Signed)
Left a message to return my call.

## 2020-07-03 NOTE — Telephone Encounter (Signed)
Patient is returning phone call for results. Please call

## 2020-07-03 NOTE — Telephone Encounter (Signed)
[name], CMA    07/03/20 3:08 PM Note ----- Message from Park Liter, MD sent at 07/03/2020  9:00 AM EST ----- Stress test showing no evidence of ischemia      The patient has been notified of the result and verbalized understanding.  All questions (if any) were answered. Nuala Alpha, LPN 09/28/3011 1:43 PM

## 2020-07-03 NOTE — Telephone Encounter (Signed)
-----   Message from Park Liter, MD sent at 07/03/2020  9:00 AM EST ----- Stress test showing no evidence of ischemia

## 2020-07-07 DIAGNOSIS — N3289 Other specified disorders of bladder: Secondary | ICD-10-CM | POA: Diagnosis not present

## 2020-07-08 DIAGNOSIS — M549 Dorsalgia, unspecified: Secondary | ICD-10-CM | POA: Diagnosis not present

## 2020-07-09 ENCOUNTER — Telehealth: Payer: Self-pay

## 2020-07-09 NOTE — Telephone Encounter (Signed)
-----   Message from Park Liter, MD sent at 07/03/2020  9:00 AM EST ----- Stress test showing no evidence of ischemia

## 2020-07-09 NOTE — Telephone Encounter (Signed)
Patient notified of results and verbalized understanding.  

## 2020-07-21 DIAGNOSIS — L570 Actinic keratosis: Secondary | ICD-10-CM | POA: Diagnosis not present

## 2020-07-21 DIAGNOSIS — C44222 Squamous cell carcinoma of skin of right ear and external auricular canal: Secondary | ICD-10-CM | POA: Diagnosis not present

## 2020-07-21 DIAGNOSIS — L821 Other seborrheic keratosis: Secondary | ICD-10-CM | POA: Diagnosis not present

## 2020-07-21 DIAGNOSIS — L578 Other skin changes due to chronic exposure to nonionizing radiation: Secondary | ICD-10-CM | POA: Diagnosis not present

## 2020-07-21 DIAGNOSIS — D171 Benign lipomatous neoplasm of skin and subcutaneous tissue of trunk: Secondary | ICD-10-CM | POA: Diagnosis not present

## 2020-07-23 ENCOUNTER — Ambulatory Visit (INDEPENDENT_AMBULATORY_CARE_PROVIDER_SITE_OTHER): Payer: Medicare Other

## 2020-07-23 ENCOUNTER — Other Ambulatory Visit: Payer: Self-pay

## 2020-07-23 DIAGNOSIS — I480 Paroxysmal atrial fibrillation: Secondary | ICD-10-CM | POA: Diagnosis not present

## 2020-07-23 DIAGNOSIS — Z8679 Personal history of other diseases of the circulatory system: Secondary | ICD-10-CM

## 2020-07-23 DIAGNOSIS — I10 Essential (primary) hypertension: Secondary | ICD-10-CM | POA: Diagnosis not present

## 2020-07-23 NOTE — Progress Notes (Signed)
Complete echocardiogram performed.  Jimmy Dionicio Shelnutt RDCS, RVT  

## 2020-07-24 LAB — ECHOCARDIOGRAM COMPLETE
Area-P 1/2: 3.03 cm2
S' Lateral: 3.3 cm

## 2020-10-01 DIAGNOSIS — Z6825 Body mass index (BMI) 25.0-25.9, adult: Secondary | ICD-10-CM | POA: Diagnosis not present

## 2020-10-01 DIAGNOSIS — J40 Bronchitis, not specified as acute or chronic: Secondary | ICD-10-CM | POA: Diagnosis not present

## 2020-10-01 DIAGNOSIS — J329 Chronic sinusitis, unspecified: Secondary | ICD-10-CM | POA: Diagnosis not present

## 2020-10-01 DIAGNOSIS — E663 Overweight: Secondary | ICD-10-CM | POA: Diagnosis not present

## 2020-10-06 DIAGNOSIS — E039 Hypothyroidism, unspecified: Secondary | ICD-10-CM | POA: Diagnosis not present

## 2020-10-06 DIAGNOSIS — I700 Atherosclerosis of aorta: Secondary | ICD-10-CM | POA: Diagnosis not present

## 2020-10-06 DIAGNOSIS — I480 Paroxysmal atrial fibrillation: Secondary | ICD-10-CM | POA: Diagnosis not present

## 2020-10-06 DIAGNOSIS — F5102 Adjustment insomnia: Secondary | ICD-10-CM | POA: Diagnosis not present

## 2020-10-09 DIAGNOSIS — Z79899 Other long term (current) drug therapy: Secondary | ICD-10-CM | POA: Diagnosis not present

## 2020-10-09 DIAGNOSIS — I480 Paroxysmal atrial fibrillation: Secondary | ICD-10-CM | POA: Diagnosis not present

## 2020-10-09 DIAGNOSIS — F5102 Adjustment insomnia: Secondary | ICD-10-CM | POA: Diagnosis not present

## 2020-10-09 DIAGNOSIS — E782 Mixed hyperlipidemia: Secondary | ICD-10-CM | POA: Diagnosis not present

## 2020-10-09 DIAGNOSIS — Z0000 Encounter for general adult medical examination without abnormal findings: Secondary | ICD-10-CM | POA: Diagnosis not present

## 2020-10-09 DIAGNOSIS — R1011 Right upper quadrant pain: Secondary | ICD-10-CM | POA: Diagnosis not present

## 2020-10-15 DIAGNOSIS — R1011 Right upper quadrant pain: Secondary | ICD-10-CM | POA: Diagnosis not present

## 2020-10-15 DIAGNOSIS — N281 Cyst of kidney, acquired: Secondary | ICD-10-CM | POA: Diagnosis not present

## 2020-12-15 DIAGNOSIS — H5203 Hypermetropia, bilateral: Secondary | ICD-10-CM | POA: Diagnosis not present

## 2020-12-15 DIAGNOSIS — H524 Presbyopia: Secondary | ICD-10-CM | POA: Diagnosis not present

## 2020-12-15 DIAGNOSIS — H52223 Regular astigmatism, bilateral: Secondary | ICD-10-CM | POA: Diagnosis not present

## 2020-12-15 DIAGNOSIS — H25813 Combined forms of age-related cataract, bilateral: Secondary | ICD-10-CM | POA: Diagnosis not present

## 2020-12-15 DIAGNOSIS — H35341 Macular cyst, hole, or pseudohole, right eye: Secondary | ICD-10-CM | POA: Diagnosis not present

## 2021-08-11 DIAGNOSIS — R109 Unspecified abdominal pain: Secondary | ICD-10-CM | POA: Diagnosis not present

## 2021-09-21 ENCOUNTER — Ambulatory Visit (INDEPENDENT_AMBULATORY_CARE_PROVIDER_SITE_OTHER): Payer: PPO | Admitting: Cardiology

## 2021-09-21 ENCOUNTER — Ambulatory Visit (INDEPENDENT_AMBULATORY_CARE_PROVIDER_SITE_OTHER): Payer: PPO

## 2021-09-21 ENCOUNTER — Encounter: Payer: Self-pay | Admitting: Cardiology

## 2021-09-21 VITALS — BP 156/90 | HR 75 | Ht 69.0 in | Wt 179.0 lb

## 2021-09-21 DIAGNOSIS — I422 Other hypertrophic cardiomyopathy: Secondary | ICD-10-CM | POA: Diagnosis not present

## 2021-09-21 DIAGNOSIS — I480 Paroxysmal atrial fibrillation: Secondary | ICD-10-CM

## 2021-09-21 DIAGNOSIS — E782 Mixed hyperlipidemia: Secondary | ICD-10-CM | POA: Diagnosis not present

## 2021-09-21 DIAGNOSIS — I10 Essential (primary) hypertension: Secondary | ICD-10-CM | POA: Diagnosis not present

## 2021-09-21 NOTE — Progress Notes (Signed)
?Cardiology Office Note:   ? ?Date:  09/21/2021  ? ?ID:  Ronald Stokes, DOB [birth year], MRN [id number] ? ?PCP:  Serita Grammes, MD  ?Cardiologist:  Jenne Campus, MD   ? ?Referring MD: Serita Grammes, MD  ? ?Chief Complaint  ?Patient presents with  ? Follow-up  ?I am doing fine ? ?History of Present Illness:   ? ?Ronald Stokes is a 71 y.o. male with past medical history significant for atypical form of hypertrophic cardiomyopathy that diagnosis has been established almost 7 years ago.  He does not follow-up on the regular basis.  He is being lost for follow-up for about 5 years that he showed up in 2021 in December since that time this is the first visit after that.  In the meantime echocardiogram has been repeated which showed moderate to left ventricle hypertrophy, mild left atrial enlargement.  Another issue is paroxysmal atrial fibrillation he denies having a palpitations.  He is still on amiodarone.  The biggest problem and this is what contributed to his inability to follow-up on the regular basis is the fact that he does have a wife with severe, advanced dementia he is spending time taking care of her he does not have time to take care of himself.  Overall he says he is doing quite well.  He denies of any chest pain tightness squeezing pressure burning chest no dizziness no passing out. ? ?Past Medical History:  ?Diagnosis Date  ? Apical variant hypertrophic cardiomyopathy ([place]) 02/09/2015  ? Atrial fibrillation ([place])   ? Atypical chest pain 04/18/2018  ? CKD (chronic kidney disease)   ? Essential hypertension 02/09/2015  ? GERD (gastroesophageal reflux disease)   ? History of hypertrophic cardiomyopathy 04/18/2018  ? Hyperlipidemia   ? Hypothyroidism   ? On amiodarone therapy 02/09/2015  ? PAF (paroxysmal atrial fibrillation) ([place]) 02/09/2015  ? Paroxysmal atrial fibrillation ([place]) 04/18/2018  ? Personal history of radiation therapy 04/18/2018  ? Prostate cancer ([place]) 04/18/2018  ? Status post ablation of  atrial fibrillation 04/18/2018  ? Syncope   ? WPW (Wolff-Parkinson-White syndrome)   ? ? ?Past Surgical History:  ?Procedure Laterality Date  ? COLONOSCOPY  05/28/2014  ? Colonic polyp status post polypectomy. Moderate sigmoid diverticulosis. Limited examination due to quality of preparation. \  ? ESOPHAGOGASTRODUODENOSCOPY  12/18/2014  ? Normal EGD.   ? HERNIA REPAIR    ? ROTATOR CUFF REPAIR    ? WPW corrective surgery    ? ? ?Current Medications: ?Current Meds  ?Medication Sig  ? amiodarone (PACERONE) 200 MG tablet Take 200 mg by mouth daily.  ? ezetimibe (ZETIA) 10 MG tablet Take 1 tablet by mouth daily.  ? levothyroxine (SYNTHROID) 112 MCG tablet Take 112 mcg by mouth daily before breakfast.  ? Multiple Vitamin (MULTIVITAMIN) capsule Take 1 capsule by mouth daily. Balance of Nature 8-10 tablets ?[place] 5 sips  ? simvastatin (ZOCOR) 10 MG tablet Take 10 mg by mouth daily.  ?  ? ?Allergies:   Patient has no known allergies.  ? ?Social History  ? ?Socioeconomic History  ? Marital status: Married  ?  Spouse name: Not on file  ? Number of children: Not on file  ? Years of education: Not on file  ? Highest education level: Not on file  ?Occupational History  ? Not on file  ?Tobacco Use  ? Smoking status: Former  ?  Types: Cigars  ? Smokeless tobacco: Former  ?Vaping Use  ? Vaping Use: Never used  ?Substance and Sexual  Activity  ? Alcohol use: Yes  ?  Comment: ocassionally, beer once in a while  ? Drug use: Never  ? Sexual activity: Not on file  ?Other Topics Concern  ? Not on file  ?Social History Narrative  ? Not on file  ? ?Social Determinants of Health  ? ?Financial Resource Strain: Not on file  ?Food Insecurity: Not on file  ?Transportation Needs: Not on file  ?Physical Activity: Not on file  ?Stress: Not on file  ?Social Connections: Not on file  ?  ? ?Family History: ?The patient's family history includes Heart disease in his father. There is no history of Colon cancer or Esophageal cancer. ?ROS:    ?Please see the history of present illness.    ?All 14 point review of systems negative except as described per history of present illness ? ?EKGs/Labs/Other Studies Reviewed:   ? ? ? ?Recent Labs: ?No results found for requested labs within last 8760 hours.  ?Recent Lipid Panel ?No results found for: CHOL, TRIG, HDL, CHOLHDL, VLDL, LDLCALC, LDLDIRECT ? ?Physical Exam:   ? ?VS:  BP (!) 156/90 (BP Location: Left Arm, Patient Position: Sitting)   Pulse 75   Ht 5\' 9"  (1.753 m)   Wt 179 lb (81.2 kg)   SpO2 94%   BMI 26.43 kg/m?    ? ?Wt Readings from Last 3 Encounters:  ?09/21/21 179 lb (81.2 kg)  ?07/02/20 174 lb (78.9 kg)  ?06/01/20 174 lb (78.9 kg)  ?  ? ?GEN:  Well nourished, well developed in no acute distress ?HEENT: Normal ?NECK: No JVD; No carotid bruits ?LYMPHATICS: No lymphadenopathy ?CARDIAC: RRR, no murmurs, no rubs, no gallops ?RESPIRATORY:  Clear to auscultation without rales, wheezing or rhonchi  ?ABDOMEN: Soft, non-tender, non-distended ?MUSCULOSKELETAL:  No edema; No deformity  ?SKIN: Warm and dry ?LOWER EXTREMITIES: no swelling ?NEUROLOGIC:  Alert and oriented x 3 ?PSYCHIATRIC:  Normal affect  ? ?ASSESSMENT:   ? ?1. Paroxysmal atrial fibrillation (HCC)   ?2. Essential hypertension   ?3. Apical variant hypertrophic cardiomyopathy ([place])   ?4. Mixed hyperlipidemia   ? ?PLAN:   ? ?In order of problems listed above: ? ?Paroxysmal atrial fibrillation.  Denies having a palpitation I will put Zio patch on him for 2 weeks to see if he got any recurrences of this arrhythmia.  He is on amiodarone right now 200 mg daily which I will continue for now but in the future we may be able to reduce this medication.  I will also schedule him to have another echocardiogram to look at left ventricle ejection fraction and look for size of left atrium. ?Apical variant of hypertrophic cardiomyopathy however last echocardiogram did not reflect that.  We will repeat echocardiogram to recheck that we may be forced to do MRI  on him to clarify the diagnosis ?Mixed dyslipidemia I do have some old data showing me his LDL of 221!Marland Kitchen  I asked him to have fasting lipid profile done today. ? ? ?Medication Adjustments/Labs and Tests Ordered: ?Current medicines are reviewed at length with the patient today.  Concerns regarding medicines are outlined above.  ?No orders of the defined types were placed in this encounter. ? ?Medication changes: No orders of the defined types were placed in this encounter. ? ? ?Signed, ?Park Liter, MD, [facility] ?09/21/2021 3:13 PM    ?[place] ?

## 2021-09-21 NOTE — Patient Instructions (Addendum)
Medication Instructions: ?Your physician recommends that you continue on your current medications as directed. Please refer to the Current Medication list given to you today. ? ?Lab Work: ?Your physician recommends that you return for lab work in:  ? ?Labs today: Lipids and LFT ? ?If you have labs (blood work) drawn today and your tests are completely normal, you will receive your results only by: ?MyChart Message (if you have MyChart) OR ?A paper copy in the mail ?If you have any lab test that is abnormal or we need to change your treatment, we will call you to review the results. ? ? ?Testing/Procedures: ?A zio monitor was ordered today. It will remain on for 14 days. You will then return monitor and event diary in provided box. It takes 1-2 weeks for report to be downloaded and returned to [place]. We will call you with the results. If monitor falls off or has orange flashing light, please call Zio for further instructions.  ? ?Your physician has requested that you have an echocardiogram. Echocardiography is a painless test that uses sound waves to create images of your heart. It provides your doctor with information about the size and shape of your heart and how well your heart?s chambers and valves are working. This procedure takes approximately one hour. There are no restrictions for this procedure. ? ? ? ?Follow-Up: ?At [facility], you and your health needs are our priority.  As part of our continuing mission to provide you with exceptional heart care, we have created designated Provider Care Teams.  These Care Teams include your primary Cardiologist (physician) and Advanced Practice Providers (APPs -  Physician Assistants and Nurse Practitioners) who all work together to provide you with the care you need, when you need it. ? ?We recommend signing up for the patient portal called "MyChart".  Sign up information is provided on this After Visit Summary.  MyChart is used to connect with patients for Virtual  Visits (Telemedicine).  Patients are able to view lab/test results, encounter notes, upcoming appointments, etc.  Non-urgent messages can be sent to your provider as well.   ?To learn more about what you can do with MyChart, go to [email or web address].   ? ?Your next appointment:   ?3 month(s) ? ?The format for your next appointment:   ?In Person ? ?Provider:   ?Jenne Campus, MD  ? ? ?Other Instructions ?None  ?

## 2021-09-22 LAB — LIPID PANEL
Chol/HDL Ratio: 4.8 ratio (ref 0.0–5.0)
Cholesterol, Total: 247 mg/dL — ABNORMAL HIGH (ref 100–199)
HDL: 52 mg/dL (ref >39)
LDL Chol Calc (NIH): 156 mg/dL — ABNORMAL HIGH (ref 0–99)
Triglycerides: 215 mg/dL — ABNORMAL HIGH (ref 0–149)
VLDL Cholesterol Cal: 39 mg/dL (ref 5–40)

## 2021-09-22 LAB — HEPATIC FUNCTION PANEL
ALT: 31 IU/L (ref 0–44)
AST: 35 IU/L (ref 0–40)
Albumin: 4.7 g/dL (ref 3.8–4.8)
Alkaline Phosphatase: 82 IU/L (ref 44–121)
Bilirubin Total: 0.3 mg/dL (ref 0.0–1.2)
Bilirubin, Direct: 0.12 mg/dL (ref 0.00–0.40)
Total Protein: 7.5 g/dL (ref 6.0–8.5)

## 2021-09-24 ENCOUNTER — Ambulatory Visit (INDEPENDENT_AMBULATORY_CARE_PROVIDER_SITE_OTHER): Payer: PPO

## 2021-09-24 DIAGNOSIS — E782 Mixed hyperlipidemia: Secondary | ICD-10-CM

## 2021-09-24 DIAGNOSIS — I10 Essential (primary) hypertension: Secondary | ICD-10-CM | POA: Diagnosis not present

## 2021-09-24 DIAGNOSIS — I422 Other hypertrophic cardiomyopathy: Secondary | ICD-10-CM | POA: Diagnosis not present

## 2021-09-24 DIAGNOSIS — I480 Paroxysmal atrial fibrillation: Secondary | ICD-10-CM

## 2021-09-24 LAB — ECHOCARDIOGRAM COMPLETE
Area-P 1/2: 4.24 cm2
S' Lateral: 2.8 cm

## 2021-09-27 ENCOUNTER — Telehealth: Payer: Self-pay

## 2021-09-27 NOTE — Telephone Encounter (Signed)
Letter requesting a call back mailed to the patient as a last attempt to reach out to him.  ?

## 2021-09-28 NOTE — Progress Notes (Signed)
Echocardiogram showed known apical hypertrophic cardiomyopathy, otherwise unchanged

## 2021-10-04 ENCOUNTER — Telehealth: Payer: Self-pay

## 2021-10-04 MED ORDER — ATORVASTATIN CALCIUM 20 MG PO TABS: 20.0000 mg | ORAL_TABLET | Freq: Every day | ORAL | 0 refills | Status: DC

## 2021-10-04 NOTE — Telephone Encounter (Signed)
-----   Message from Park Liter, MD sent at 10/01/2021  1:01 PM EDT ----- ?Lets start atorvastatin 20 mg daily and see if he can tolerate that.  Done fasting lipid profile, AST LT 6 weeks ?----- Message ----- ?From: Tyler Pita, RN ?Sent: 09/27/2021  12:50 PM EDT ?To: Park Liter, MD ? ?Pt stated that he was om Lipitor in the past and it caused joint pain. Is there anything else you would recommend? ? ?

## 2021-10-12 DIAGNOSIS — E782 Mixed hyperlipidemia: Secondary | ICD-10-CM | POA: Diagnosis not present

## 2021-10-12 DIAGNOSIS — I480 Paroxysmal atrial fibrillation: Secondary | ICD-10-CM | POA: Diagnosis not present

## 2021-10-12 DIAGNOSIS — I422 Other hypertrophic cardiomyopathy: Secondary | ICD-10-CM | POA: Diagnosis not present

## 2021-10-12 DIAGNOSIS — I10 Essential (primary) hypertension: Secondary | ICD-10-CM | POA: Diagnosis not present

## 2021-10-19 DIAGNOSIS — D124 Benign neoplasm of descending colon: Secondary | ICD-10-CM | POA: Diagnosis not present

## 2021-10-19 DIAGNOSIS — I4891 Unspecified atrial fibrillation: Secondary | ICD-10-CM | POA: Diagnosis not present

## 2021-10-19 DIAGNOSIS — Z1211 Encounter for screening for malignant neoplasm of colon: Secondary | ICD-10-CM | POA: Diagnosis not present

## 2021-10-19 DIAGNOSIS — Z8546 Personal history of malignant neoplasm of prostate: Secondary | ICD-10-CM | POA: Diagnosis not present

## 2021-10-19 DIAGNOSIS — Z86010 Personal history of colonic polyps: Secondary | ICD-10-CM | POA: Diagnosis not present

## 2021-10-19 DIAGNOSIS — K627 Radiation proctitis: Secondary | ICD-10-CM | POA: Diagnosis not present

## 2021-10-19 DIAGNOSIS — K5730 Diverticulosis of large intestine without perforation or abscess without bleeding: Secondary | ICD-10-CM | POA: Diagnosis not present

## 2021-10-19 DIAGNOSIS — D126 Benign neoplasm of colon, unspecified: Secondary | ICD-10-CM | POA: Diagnosis not present

## 2021-10-19 DIAGNOSIS — D122 Benign neoplasm of ascending colon: Secondary | ICD-10-CM | POA: Diagnosis not present

## 2021-10-19 DIAGNOSIS — D123 Benign neoplasm of transverse colon: Secondary | ICD-10-CM | POA: Diagnosis not present

## 2021-10-19 DIAGNOSIS — K635 Polyp of colon: Secondary | ICD-10-CM | POA: Diagnosis not present

## 2021-10-19 DIAGNOSIS — F17220 Nicotine dependence, chewing tobacco, uncomplicated: Secondary | ICD-10-CM | POA: Diagnosis not present

## 2021-10-19 DIAGNOSIS — K648 Other hemorrhoids: Secondary | ICD-10-CM | POA: Diagnosis not present

## 2021-10-27 ENCOUNTER — Telehealth: Payer: Self-pay

## 2021-10-27 DIAGNOSIS — Z79899 Other long term (current) drug therapy: Secondary | ICD-10-CM | POA: Diagnosis not present

## 2021-10-27 DIAGNOSIS — I480 Paroxysmal atrial fibrillation: Secondary | ICD-10-CM | POA: Diagnosis not present

## 2021-10-27 NOTE — Telephone Encounter (Signed)
-----   Message from Park Liter, MD sent at 10/26/2021  9:00 PM EDT ----- ?Monitor showed 24% of the time atrial fibrillation.  He needs to be anticoagulated.  Please do CBC Chem-7 stool for guaiac and then if that is negative we will start Eliquis 5 mg twice daily until we have those test back can start ?

## 2021-10-27 NOTE — Telephone Encounter (Signed)
Patient notified of results and recommendations and agreed with plan. Lab order on file. Patient will stop by this week.  ?

## 2021-10-28 LAB — CBC
Hematocrit: 42.2 % (ref 37.5–51.0)
Hemoglobin: 14.4 g/dL (ref 13.0–17.7)
MCH: 32.8 pg (ref 26.6–33.0)
MCHC: 34.1 g/dL (ref 31.5–35.7)
MCV: 96 fL (ref 79–97)
Platelets: 171 10*3/uL (ref 150–450)
RBC: 4.39 x10E6/uL (ref 4.14–5.80)
RDW: 12.3 % (ref 11.6–15.4)
WBC: 4 10*3/uL (ref 3.4–10.8)

## 2021-10-28 LAB — FECAL OCCULT BLOOD, IMMUNOCHEMICAL: Fecal Occult Bld: NEGATIVE

## 2021-11-01 ENCOUNTER — Telehealth: Payer: Self-pay

## 2021-11-01 ENCOUNTER — Telehealth: Payer: Self-pay | Admitting: Cardiology

## 2021-11-01 MED ORDER — APIXABAN 5 MG PO TABS: 5.0000 mg | ORAL_TABLET | Freq: Two times a day (BID) | ORAL | 3 refills | Status: DC

## 2021-11-01 NOTE — Telephone Encounter (Signed)
Spoke with patient regarding results of labs and Dr. Wendy Poet recommendations. ? ?Per Dr. Agustin Cree: ?There is no blood in the stool, ideally need to stop aspirin and start Eliquis 5 mg twice daily. ? ?Eliquis 5mg  BID ordered and sent to pharmacy of choice. ? ?Will forward to Dr. Wendy Poet nurse and CMA to gather samples for patient if available. ?

## 2021-11-01 NOTE — Telephone Encounter (Signed)
Pt returning call regarding test results. Please advise ?

## 2021-11-01 NOTE — Telephone Encounter (Signed)
Per Triage message. Left samples of Eliquis 5 mg # 56 for pt to pick up. Pt aware. ?

## 2021-11-02 ENCOUNTER — Telehealth: Payer: Self-pay

## 2021-11-02 MED ORDER — APIXABAN 5 MG PO TABS: 5.0000 mg | ORAL_TABLET | Freq: Two times a day (BID) | ORAL | 2 refills | Status: DC

## 2021-11-02 NOTE — Telephone Encounter (Signed)
Patient notified of results and recommendations, he agreed with plan. Rx sent  ?

## 2021-11-02 NOTE — Telephone Encounter (Signed)
-----   Message from Park Liter, MD sent at 10/26/2021  9:00 PM EDT ----- ?Monitor showed 24% of the time atrial fibrillation.  He needs to be anticoagulated.  Please do CBC Chem-7 stool for guaiac and then if that is negative we will start Eliquis 5 mg twice daily until we have those test back can start ?

## 2021-11-02 NOTE — Telephone Encounter (Signed)
Patient stated he got samples yesterday ?

## 2021-11-02 NOTE — Telephone Encounter (Signed)
-----   Message from Park Liter, MD sent at 10/29/2021 12:32 PM EDT ----- ?There is no blood in the stool, ideally need to stop aspirin and start Eliquis 5 mg twice daily ?

## 2021-11-02 NOTE — Telephone Encounter (Signed)
LM to return my call. 

## 2021-11-08 DIAGNOSIS — Z6826 Body mass index (BMI) 26.0-26.9, adult: Secondary | ICD-10-CM | POA: Diagnosis not present

## 2021-11-08 DIAGNOSIS — D6869 Other thrombophilia: Secondary | ICD-10-CM | POA: Diagnosis not present

## 2021-11-08 DIAGNOSIS — E782 Mixed hyperlipidemia: Secondary | ICD-10-CM | POA: Diagnosis not present

## 2021-11-08 DIAGNOSIS — R222 Localized swelling, mass and lump, trunk: Secondary | ICD-10-CM | POA: Diagnosis not present

## 2021-11-08 DIAGNOSIS — Z0000 Encounter for general adult medical examination without abnormal findings: Secondary | ICD-10-CM | POA: Diagnosis not present

## 2021-11-08 DIAGNOSIS — M545 Low back pain, unspecified: Secondary | ICD-10-CM | POA: Diagnosis not present

## 2021-11-08 DIAGNOSIS — I480 Paroxysmal atrial fibrillation: Secondary | ICD-10-CM | POA: Diagnosis not present

## 2021-11-08 DIAGNOSIS — Z131 Encounter for screening for diabetes mellitus: Secondary | ICD-10-CM | POA: Diagnosis not present

## 2021-11-08 DIAGNOSIS — I700 Atherosclerosis of aorta: Secondary | ICD-10-CM | POA: Diagnosis not present

## 2021-11-08 DIAGNOSIS — C61 Malignant neoplasm of prostate: Secondary | ICD-10-CM | POA: Diagnosis not present

## 2021-11-08 DIAGNOSIS — Z79899 Other long term (current) drug therapy: Secondary | ICD-10-CM | POA: Diagnosis not present

## 2021-11-08 DIAGNOSIS — N1831 Chronic kidney disease, stage 3a: Secondary | ICD-10-CM | POA: Diagnosis not present

## 2021-11-09 DIAGNOSIS — R7989 Other specified abnormal findings of blood chemistry: Secondary | ICD-10-CM | POA: Diagnosis not present

## 2021-11-11 DIAGNOSIS — R109 Unspecified abdominal pain: Secondary | ICD-10-CM | POA: Diagnosis not present

## 2021-11-11 DIAGNOSIS — R1909 Other intra-abdominal and pelvic swelling, mass and lump: Secondary | ICD-10-CM | POA: Diagnosis not present

## 2021-11-11 DIAGNOSIS — R222 Localized swelling, mass and lump, trunk: Secondary | ICD-10-CM | POA: Diagnosis not present

## 2021-11-24 DIAGNOSIS — M5451 Vertebrogenic low back pain: Secondary | ICD-10-CM | POA: Diagnosis not present

## 2021-11-24 DIAGNOSIS — M545 Low back pain, unspecified: Secondary | ICD-10-CM | POA: Insufficient documentation

## 2021-12-22 ENCOUNTER — Encounter: Payer: Self-pay | Admitting: Cardiology

## 2021-12-22 ENCOUNTER — Ambulatory Visit: Payer: PPO | Admitting: Cardiology

## 2021-12-22 VITALS — BP 110/60 | HR 51 | Ht 70.0 in | Wt 179.4 lb

## 2021-12-22 DIAGNOSIS — E782 Mixed hyperlipidemia: Secondary | ICD-10-CM | POA: Diagnosis not present

## 2021-12-22 DIAGNOSIS — I10 Essential (primary) hypertension: Secondary | ICD-10-CM

## 2021-12-22 DIAGNOSIS — Z8679 Personal history of other diseases of the circulatory system: Secondary | ICD-10-CM | POA: Diagnosis not present

## 2021-12-22 DIAGNOSIS — I422 Other hypertrophic cardiomyopathy: Secondary | ICD-10-CM | POA: Diagnosis not present

## 2021-12-22 DIAGNOSIS — I480 Paroxysmal atrial fibrillation: Secondary | ICD-10-CM

## 2021-12-22 DIAGNOSIS — Z9889 Other specified postprocedural states: Secondary | ICD-10-CM | POA: Diagnosis not present

## 2021-12-22 MED ORDER — AMIODARONE HCL 200 MG PO TABS: ORAL_TABLET | ORAL | 1 refills | Status: DC

## 2021-12-22 MED ORDER — ATORVASTATIN CALCIUM 40 MG PO TABS: 40.0000 mg | ORAL_TABLET | Freq: Every day | ORAL | 3 refills | Status: DC

## 2021-12-22 NOTE — Progress Notes (Unsigned)
Cardiology Office Note:    Date:  12/22/2021   ID:  Ronald Stokes, DOB 1951/01/24, MRN [id number]  PCP:  Serita Grammes, MD  Cardiologist:  Jenne Campus, MD    Referring MD: Serita Grammes, MD   Chief Complaint  Patient presents with   Decreased Visual Acuity   Weakness    History of Present Illness:    Ronald Stokes is a 71 y.o. male with past medical history significant for hypertrophic cardiomyopathy.  This diagnosis was established about 7 years ago.  This is actually apical variant of hypertrophic cardiomyopathy.  He disappeared from follow-up for about 5 years and then show up again in December 2021.  He also got history of paroxysmal atrial fibrillation, dyslipidemia, prostate cancer history of WPW in the chart, status post ablation of atrial fibrillation.  He comes today 2 months for follow-up overall he is complaining of being weak tired exhausted.  He have difficulty sleeping at night.  He is taking care of demented wife and it is really taking toll on him.  She is bedridden she does not talk he have to feed her.  Is really very difficult for him.  Denies having any palpitations no dizziness no passing out.  Recently he wore a monitor which showed quite significant burden of his atrial fibrillation, but the percentage of 24.  Past Medical History:  Diagnosis Date   Apical variant hypertrophic cardiomyopathy ([place]) 02/09/2015   Atrial fibrillation ([place])    Atypical chest pain 04/18/2018   CKD (chronic kidney disease)    Essential hypertension 02/09/2015   GERD (gastroesophageal reflux disease)    History of hypertrophic cardiomyopathy 04/18/2018   Hyperlipidemia    Hypothyroidism    On amiodarone therapy 02/09/2015   PAF (paroxysmal atrial fibrillation) ([place]) 02/09/2015   Paroxysmal atrial fibrillation ([place]) 04/18/2018   Personal history of radiation therapy 04/18/2018   Prostate cancer ([place]) 04/18/2018   Status post ablation of atrial fibrillation 04/18/2018   Syncope     WPW (Wolff-Parkinson-White syndrome)     Past Surgical History:  Procedure Laterality Date   COLONOSCOPY  05/28/2014   Colonic polyp status post polypectomy. Moderate sigmoid diverticulosis. Limited examination due to quality of preparation. \   ESOPHAGOGASTRODUODENOSCOPY  12/18/2014   Normal EGD.    HERNIA REPAIR     ROTATOR CUFF REPAIR     WPW corrective surgery      Current Medications: Current Meds  Medication Sig   amiodarone (PACERONE) 200 MG tablet Take 200 mg by mouth daily.   apixaban (ELIQUIS) 5 MG TABS tablet Take 1 tablet (5 mg total) by mouth 2 (two) times daily.   atorvastatin (LIPITOR) 20 MG tablet Take 1 tablet (20 mg total) by mouth daily.   ezetimibe (ZETIA) 10 MG tablet Take 1 tablet by mouth daily.   levothyroxine (SYNTHROID) 112 MCG tablet Take 112 mcg by mouth daily before breakfast.   Multiple Vitamin (MULTIVITAMIN) capsule Take 1 capsule by mouth daily. Balance of Nature 8-10 tablets [place] 5 sips     Allergies:   Patient has no known allergies.   Social History   Socioeconomic History   Marital status: Married    Spouse name: Not on file   Number of children: Not on file   Years of education: Not on file   Highest education level: Not on file  Occupational History   Not on file  Tobacco Use   Smoking status: Former    Types: Cigars   Smokeless tobacco: Former  Vaping Use   Vaping Use: Never used  Substance and Sexual Activity   Alcohol use: Yes    Comment: ocassionally, beer once in a while   Drug use: Never   Sexual activity: Not on file  Other Topics Concern   Not on file  Social History Narrative   Not on file   Social Determinants of Health   Financial Resource Strain: Not on file  Food Insecurity: Not on file  Transportation Needs: Not on file  Physical Activity: Not on file  Stress: Not on file  Social Connections: Not on file     Family History: The patient's family history includes Heart disease in his father.  There is no history of Colon cancer or Esophageal cancer. ROS:   Please see the history of present illness.    All 14 point review of systems negative except as described per history of present illness  EKGs/Labs/Other Studies Reviewed:      Recent Labs: 09/21/2021: ALT 31 10/27/2021: Hemoglobin 14.4; Platelets 171  Recent Lipid Panel    Component Value Date/Time   CHOL 247 (H) 09/21/2021 1552   TRIG 215 (H) 09/21/2021 1552   HDL 52 09/21/2021 1552   CHOLHDL 4.8 09/21/2021 1552   LDLCALC 156 (H) 09/21/2021 1552    Physical Exam:    VS:  BP 110/60 (BP Location: Left Arm, Patient Position: Sitting)   Pulse (!) 51   Ht 5\' 10"  (1.778 m)   Wt 179 lb 6.4 oz (81.4 kg)   SpO2 92%   BMI 25.74 kg/m     Wt Readings from Last 3 Encounters:  12/22/21 179 lb 6.4 oz (81.4 kg)  09/21/21 179 lb (81.2 kg)  07/02/20 174 lb (78.9 kg)     GEN:  Well nourished, well developed in no acute distress HEENT: Normal NECK: No JVD; No carotid bruits LYMPHATICS: No lymphadenopathy CARDIAC: RRR, no murmurs, no rubs, no gallops RESPIRATORY:  Clear to auscultation without rales, wheezing or rhonchi  ABDOMEN: Soft, non-tender, non-distended MUSCULOSKELETAL:  No edema; No deformity  SKIN: Warm and dry LOWER EXTREMITIES: no swelling NEUROLOGIC:  Alert and oriented x 3 PSYCHIATRIC:  Normal affect   ASSESSMENT:    1. Paroxysmal atrial fibrillation (HCC)   2. Essential hypertension   3. Apical variant hypertrophic cardiomyopathy ([place])   4. Status post ablation of atrial fibrillation   5. Mixed hyperlipidemia    PLAN:    In order of problems listed above:  Paroxysmal atrial fibrillation with high burden.  I will ask him to increase amiodarone to 200 mg twice daily for about 2 weeks he will schedule him to have a EP team.  We initiated anticoagulation.  He does have hypertrophic cardiomyopathy before anticoagulation need to be used regardless with CHADS2 Vascor is. Dyslipidemia I did review his K  PN which still show unacceptably high cholesterol with LDL of 156.  He is taking Lipitor 20 and Zetia 10 will increase Lipitor to 40 mg daily. Apical variant of hypertrophic cardiomyopathy.  No ventricular tachycardia no syncope no first-degree relatives with sudden cardiac death.  Continue present management.   Medication Adjustments/Labs and Tests Ordered: Current medicines are reviewed at length with the patient today.  Concerns regarding medicines are outlined above.  Orders Placed This Encounter  Procedures   EKG 12-Lead   Medication changes: No orders of the defined types were placed in this encounter.   Signed, Park Liter, MD, [facility] 12/22/2021 1:32 PM    [facility]

## 2021-12-22 NOTE — Patient Instructions (Signed)
Medication Instructions:  Amiodarone 200mg  twice daily for 2 weeks Lipitor 20mg  increase to 40mg  - you may double your current dose and your next refill will be 40mg  1 daily.   Lab Work: Your physician recommends that you return for lab work in: 6 weeks You need to have labs done when you are fasting.  You can come Monday through Friday 8:30 am to 12:00 pm and 1:15 to 4:30. You do not need to make an appointment as the order has already been placed. The labs you are going to have done are ALT, AST & Lipids.    Testing/Procedures: None Ordered   Follow-Up: At [facility], you and your health needs are our priority.  As part of our continuing mission to provide you with exceptional heart care, we have created designated Provider Care Teams.  These Care Teams include your primary Cardiologist (physician) and Advanced Practice Providers (APPs -  Physician Assistants and Nurse Practitioners) who all work together to provide you with the care you need, when you need it.  We recommend signing up for the patient portal called "MyChart".  Sign up information is provided on this After Visit Summary.  MyChart is used to connect with patients for Virtual Visits (Telemedicine).  Patients are able to view lab/test results, encounter notes, upcoming appointments, etc.  Non-urgent messages can be sent to your provider as well.   To learn more about what you can do with MyChart, go to [email or web address].    Your next appointment:   3 month(s)  The format for your next appointment:   In Person  Provider:   Jenne Campus, MD    Other Instructions NA

## 2022-01-24 DIAGNOSIS — H25813 Combined forms of age-related cataract, bilateral: Secondary | ICD-10-CM | POA: Diagnosis not present

## 2022-01-24 DIAGNOSIS — H52223 Regular astigmatism, bilateral: Secondary | ICD-10-CM | POA: Diagnosis not present

## 2022-01-28 ENCOUNTER — Telehealth: Payer: Self-pay | Admitting: Cardiology

## 2022-01-28 NOTE — Telephone Encounter (Signed)
Pt c/o medication issue:  1. Name of Medication:   amiodarone (PACERONE) 200 MG tablet  2. How are you currently taking this medication (dosage and times per day)?   3. Are you having a reaction (difficulty breathing--STAT)?   4. What is your medication issue?   Iris called in on behalf on the patient stating the patient was recently seen at the eye doctor and advised this medication is affecting his vision.

## 2022-01-31 NOTE — Telephone Encounter (Signed)
Called pt regarding message about Amiodarone and vision problems. LVM. Encouraged pt to call with any questions or concerns.

## 2022-02-07 ENCOUNTER — Ambulatory Visit (INDEPENDENT_AMBULATORY_CARE_PROVIDER_SITE_OTHER): Payer: PPO | Admitting: Cardiology

## 2022-02-07 ENCOUNTER — Encounter: Payer: Self-pay | Admitting: *Deleted

## 2022-02-07 ENCOUNTER — Telehealth: Payer: Self-pay | Admitting: Cardiology

## 2022-02-07 ENCOUNTER — Encounter: Payer: Self-pay | Admitting: Cardiology

## 2022-02-07 VITALS — BP 124/76 | HR 52 | Ht 70.0 in | Wt 177.6 lb

## 2022-02-07 DIAGNOSIS — Z01812 Encounter for preprocedural laboratory examination: Secondary | ICD-10-CM

## 2022-02-07 DIAGNOSIS — C44222 Squamous cell carcinoma of skin of right ear and external auricular canal: Secondary | ICD-10-CM | POA: Diagnosis not present

## 2022-02-07 DIAGNOSIS — L821 Other seborrheic keratosis: Secondary | ICD-10-CM | POA: Diagnosis not present

## 2022-02-07 DIAGNOSIS — D6869 Other thrombophilia: Secondary | ICD-10-CM | POA: Diagnosis not present

## 2022-02-07 DIAGNOSIS — L578 Other skin changes due to chronic exposure to nonionizing radiation: Secondary | ICD-10-CM | POA: Diagnosis not present

## 2022-02-07 DIAGNOSIS — I480 Paroxysmal atrial fibrillation: Secondary | ICD-10-CM

## 2022-02-07 DIAGNOSIS — L570 Actinic keratosis: Secondary | ICD-10-CM | POA: Diagnosis not present

## 2022-02-07 NOTE — Progress Notes (Signed)
Electrophysiology Office Note   Date:  02/07/2022   ID:  Ronald Stokes, DOB 05-19-1951, MRN [id number]  PCP:  Serita Grammes, MD  Cardiologist:  Agustin Cree Primary Electrophysiologist:  Mylee Falin Meredith Leeds, MD    Chief Complaint: AF   History of Present Illness: Ronald Stokes is a 71 y.o. male who is being seen today for the evaluation of AF at the request of Park Liter, MD. Presenting today for electrophysiology evaluation.  He has a past history significant for hypertrophic cardiomyopathy.  He has apical variant.  He also has paroxysmal atrial fibrillation, hyperlipidemia, prostate cancer.  He has had an ablation for his atrial fibrillation.  He wore a cardiac monitor that showed an elevated atrial fibrillation burden at 24%.  Today, he denies symptoms of palpitations, chest pain, shortness of breath, orthopnea, PND, lower extremity edema, claudication, dizziness, presyncope, syncope, bleeding, or neurologic sequela. The patient is tolerating medications without difficulties.  He has intermittent episodes of atrial fibrillation.  He does not feel much in the way of fatigue or shortness of breath.  He has some chest discomfort when he is in atrial fibrillation.  He would prefer an alternative rhythm control strategy to amiodarone.   Past Medical History:  Diagnosis Date   Apical variant hypertrophic cardiomyopathy ([place]) 02/09/2015   Atrial fibrillation ([place])    Atypical chest pain 04/18/2018   CKD (chronic kidney disease)    Essential hypertension 02/09/2015   GERD (gastroesophageal reflux disease)    History of hypertrophic cardiomyopathy 04/18/2018   Hyperlipidemia    Hypothyroidism    On amiodarone therapy 02/09/2015   PAF (paroxysmal atrial fibrillation) ([place]) 02/09/2015   Paroxysmal atrial fibrillation ([place]) 04/18/2018   Personal history of radiation therapy 04/18/2018   Prostate cancer ([place]) 04/18/2018   Status post ablation of atrial fibrillation 04/18/2018    Syncope    WPW (Wolff-Parkinson-White syndrome)    Past Surgical History:  Procedure Laterality Date   COLONOSCOPY  05/28/2014   Colonic polyp status post polypectomy. Moderate sigmoid diverticulosis. Limited examination due to quality of preparation. \   ESOPHAGOGASTRODUODENOSCOPY  12/18/2014   Normal EGD.    HERNIA REPAIR     ROTATOR CUFF REPAIR     WPW corrective surgery       Current Outpatient Medications  Medication Sig Dispense Refill   amiodarone (PACERONE) 200 MG tablet Take 1 tablet daily by mouth.  Then for 2 weeks starting 12-22-21 take 1 tablet twice daily by mouth, then resume 1 tablet daily thereafter. 104 tablet 1   apixaban (ELIQUIS) 5 MG TABS tablet Take 1 tablet (5 mg total) by mouth 2 (two) times daily. 60 tablet 2   atorvastatin (LIPITOR) 40 MG tablet Take 1 tablet (40 mg total) by mouth daily. 90 tablet 3   ezetimibe (ZETIA) 10 MG tablet Take 1 tablet by mouth daily.     levothyroxine (SYNTHROID) 112 MCG tablet Take 112 mcg by mouth daily before breakfast.     tadalafil (CIALIS) 5 MG tablet Take 5 mg by mouth daily as needed. As directed     zolpidem (AMBIEN) 10 MG tablet Take 10 mg by mouth at bedtime.     No current facility-administered medications for this visit.    Allergies:   Patient has no known allergies.   Social History:  The patient  reports that he has quit smoking. His smoking use included cigars. He has quit using smokeless tobacco. He reports current alcohol use. He reports that he does not use drugs.  Family History:  The patient's family history includes Heart disease in his father.    ROS:  Please see the history of present illness.   Otherwise, review of systems is positive for none.   All other systems are reviewed and negative.    PHYSICAL EXAM: VS:  BP 124/76   Pulse (!) 52   Ht 5\' 10"  (1.778 m)   Wt 177 lb 9.6 oz (80.6 kg)   SpO2 97%   BMI 25.48 kg/m  , BMI Body mass index is 25.48 kg/m. GEN: Well nourished, well developed,  in no acute distress  HEENT: normal  Neck: no JVD, carotid bruits, or masses Cardiac: RRR; no murmurs, rubs, or gallops,no edema  Respiratory:  clear to auscultation bilaterally, normal work of breathing GI: soft, nontender, nondistended, + BS MS: no deformity or atrophy  Skin: warm and dry Neuro:  Strength and sensation are intact Psych: euthymic mood, full affect  EKG:  EKG is ordered today. Personal review of the ekg ordered shows sinus rhythm, rate 52  Recent Labs: 09/21/2021: ALT 31 10/27/2021: Hemoglobin 14.4; Platelets 171    Lipid Panel     Component Value Date/Time   CHOL 247 (H) 09/21/2021 1552   TRIG 215 (H) 09/21/2021 1552   HDL 52 09/21/2021 1552   CHOLHDL 4.8 09/21/2021 1552   LDLCALC 156 (H) 09/21/2021 1552     Wt Readings from Last 3 Encounters:  02/07/22 177 lb 9.6 oz (80.6 kg)  12/22/21 179 lb 6.4 oz (81.4 kg)  09/21/21 179 lb (81.2 kg)      Other studies Reviewed: Additional studies/ records that were reviewed today include: TTE 09/24/21  Review of the above records today demonstrates:   1. Known apical variant of hypertrphic cardiomyopathy. Left ventricular  ejection fraction, by estimation, is 50 to 55%. The left ventricle has low  normal function. The left ventricle has no regional wall motion  abnormalities. There is moderate left  ventricular hypertrophy of the apical segment. Left ventricular diastolic  parameters are indeterminate.   2. Right ventricular systolic function is normal. The right ventricular  size is normal.   3. The mitral valve is normal in structure. No evidence of mitral valve  regurgitation. No evidence of mitral stenosis.   4. The aortic valve is normal in structure. Aortic valve regurgitation is  not visualized. Aortic valve sclerosis/calcification is present, without  any evidence of aortic stenosis.   5. The inferior vena cava is normal in size with greater than 50%  respiratory variability, suggesting right atrial  pressure of 3 mmHg.   Cardiac monitor 10/25/2021 personally reviewed Paroxysmal atrial fibrillation with total burden of 24%, average heart rate of 90, longest episode 7 hours 33 minutes.  ASSESSMENT AND PLAN:  1.  Paroxysmal atrial fibrillation: Currently on amiodarone 200 mg twice daily, Eliquis 5 mg twice daily.  CHA2DS2-VASc of 2.  He is unfortunate continue to have episodes of atrial fibrillation despite amiodarone.  Due to this, he would prefer ablation.   Risk, benefits, and alternatives to EP study and radiofrequency ablation for afib were also discussed in detail today. These risks include but are not limited to stroke, bleeding, vascular damage, tamponade, perforation, damage to the esophagus, lungs, and other structures, pulmonary vein stenosis, worsening renal function, and death. The patient understands these risk and wishes to proceed.  We Tracy Kinner therefore proceed with catheter ablation at the next available time.  Carto, ICE, anesthesia are requested for the procedure.  Sabrie Moritz also obtain CT  PV protocol prior to the procedure to exclude LAA thrombus and further evaluate atrial anatomy.   2.  Apical variant hypertrophic cardiomyopathy: No ventricular arrhythmias noted.  No indication for ICD therapy.  Continue with management per primary cardiology.  3.  Hypertension: Currently well controlled  4.  Secondary hypercoagulable state: Currently on Eliquis for atrial fibrillation as above  Current medicines are reviewed at length with the patient today.   The patient does not have concerns regarding his medicines.  The following changes were made today:  none  Labs/ tests ordered today include:  Orders Placed This Encounter  Procedures   CT CARDIAC MORPH/PULM VEIN W/CM&W/O CA SCORE   Basic metabolic panel   CBC   EKG 12-Lead     Disposition:   FU with Zachrey Deutscher 3 months  Signed, Shanin Szymanowski Meredith Leeds, MD  02/07/2022 12:38 PM     [facility] [address] [place] [id number] [phone] (office) [phone] (fax)

## 2022-02-07 NOTE — Telephone Encounter (Signed)
Pt c/o medication issue:  1. Name of Medication:  amiodarone (PACERONE) 200 MG tablet  2. How are you currently taking this medication (dosage and times per day)?   3. Are you having a reaction (difficulty breathing--STAT)?   4. What is your medication issue?   Please confirm whether or not medication has been increased.

## 2022-02-07 NOTE — Patient Instructions (Signed)
Medication Instructions:  Your physician recommends that you continue on your current medications as directed. Please refer to the Current Medication list given to you today.  *If you need a refill on your cardiac medications before your next appointment, please call your pharmacy*   Lab Work: Pre procedure labs -- see procedure instruction letter:  BMP & CBC  If you have labs (blood work) drawn today and your tests are completely normal, you will receive your results only by: [place] (if you have MyChart) OR A paper copy in the mail If you have any lab test that is abnormal or we need to change your treatment, we will call you to review the results.   Testing/Procedures: Your physician has requested that you have cardiac CT within 7 days PRIOR to your ablation. Cardiac computed tomography (CT) is a painless test that uses an x-ray machine to take clear, detailed pictures of your heart.  Please follow instruction below located under "other instructions". You will get a call from our office to schedule the date for this test.  Your physician has recommended that you have an ablation. Catheter ablation is a medical procedure used to treat some cardiac arrhythmias (irregular heartbeats). During catheter ablation, a long, thin, flexible tube is put into a blood vessel in your groin (upper thigh), or neck. This tube is called an ablation catheter. It is then guided to your heart through the blood vessel. Radio frequency waves destroy small areas of heart tissue where abnormal heartbeats may cause an arrhythmia to start. Please follow instruction letter given to you today.   Follow-Up: At [facility], you and your health needs are our priority.  As part of our continuing mission to provide you with exceptional heart care, we have created designated Provider Care Teams.  These Care Teams include your primary Cardiologist (physician) and Advanced Practice Providers (APPs -  Physician  Assistants and Nurse Practitioners) who all work together to provide you with the care you need, when you need it.  We recommend signing up for the patient portal called "MyChart".  Sign up information is provided on this After Visit Summary.  MyChart is used to connect with patients for Virtual Visits (Telemedicine).  Patients are able to view lab/test results, encounter notes, upcoming appointments, etc.  Non-urgent messages can be sent to your provider as well.   To learn more about what you can do with MyChart, go to [email or web address].    Your next appointment:   1 month(s) after your ablation  The format for your next appointment:   In Person  Provider:   AFib clinic   Thank you for choosing [facility]!!   Trinidad Curet, RN [phone]    Other Instructions   Cardiac Ablation Cardiac ablation is a procedure to destroy (ablate) some heart tissue that is sending bad signals. These bad signals cause problems in heart rhythm. The heart has many areas that make these signals. If there are problems in these areas, they can make the heart beat in a way that is not normal. Destroying some tissues can help make the heart rhythm normal. Tell your doctor about: Any allergies you have. All medicines you are taking. These include vitamins, herbs, eye drops, creams, and over-the-counter medicines. Any problems you or family members have had with medicines that make you fall asleep (anesthetics). Any blood disorders you have. Any surgeries you have had. Any medical conditions you have, such as kidney failure. Whether you are pregnant or may be  pregnant. What are the risks? This is a safe procedure. But problems may occur, including: Infection. Bruising and bleeding. Bleeding into the chest. Stroke or blood clots. Damage to nearby areas of your body. Allergies to medicines or dyes. The need for a pacemaker if the normal system is damaged. Failure of the procedure to  treat the problem. What happens before the procedure? Medicines Ask your doctor about: Changing or stopping your normal medicines. This is important. Taking aspirin and ibuprofen. Do not take these medicines unless your doctor tells you to take them. Taking other medicines, vitamins, herbs, and supplements. General instructions Follow instructions from your doctor about what you cannot eat or drink. Plan to have someone take you home from the hospital or clinic. If you will be going home right after the procedure, plan to have someone with you for 24 hours. Ask your doctor what steps will be taken to prevent infection. What happens during the procedure?  An IV tube will be put into one of your veins. You will be given a medicine to help you relax. The skin on your neck or groin will be numbed. A cut (incision) will be made in your neck or groin. A needle will be put through your cut and into a large vein. A tube (catheter) will be put into the needle. The tube will be moved to your heart. Dye may be put through the tube. This helps your doctor see your heart. Small devices (electrodes) on the tube will send out signals. A type of energy will be used to destroy some heart tissue. The tube will be taken out. Pressure will be held on your cut. This helps stop bleeding. A bandage will be put over your cut. The exact procedure may vary among doctors and hospitals. What happens after the procedure? You will be watched until you leave the hospital or clinic. This includes checking your heart rate, breathing rate, oxygen, and blood pressure. Your cut will be watched for bleeding. You will need to lie still for a few hours. Do not drive for 24 hours or as long as your doctor tells you. Summary Cardiac ablation is a procedure to destroy some heart tissue. This is done to treat heart rhythm problems. Tell your doctor about any medical conditions you may have. Tell him or her about all medicines  you are taking to treat them. This is a safe procedure. But problems may occur. These include infection, bruising, bleeding, and damage to nearby areas of your body. Follow what your doctor tells you about food and drink. You may also be told to change or stop some of your medicines. After the procedure, do not drive for 24 hours or as long as your doctor tells you. This information is not intended to replace advice given to you by your health care provider. Make sure you discuss any questions you have with your health care provider. Document Revised: 08/27/2021 Document Reviewed: 05/09/2019 Elsevier Patient Education  [place].

## 2022-02-07 NOTE — Telephone Encounter (Signed)
Informed pharmacy that pt can decrease back  to 1 tablet daily.

## 2022-02-08 DIAGNOSIS — N1831 Chronic kidney disease, stage 3a: Secondary | ICD-10-CM | POA: Insufficient documentation

## 2022-02-08 DIAGNOSIS — Z8546 Personal history of malignant neoplasm of prostate: Secondary | ICD-10-CM | POA: Insufficient documentation

## 2022-02-08 DIAGNOSIS — K635 Polyp of colon: Secondary | ICD-10-CM | POA: Insufficient documentation

## 2022-02-08 DIAGNOSIS — I480 Paroxysmal atrial fibrillation: Secondary | ICD-10-CM | POA: Diagnosis not present

## 2022-02-08 DIAGNOSIS — I422 Other hypertrophic cardiomyopathy: Secondary | ICD-10-CM | POA: Diagnosis not present

## 2022-03-19 ENCOUNTER — Other Ambulatory Visit: Payer: Self-pay | Admitting: Cardiology

## 2022-03-19 DIAGNOSIS — I480 Paroxysmal atrial fibrillation: Secondary | ICD-10-CM

## 2022-03-21 NOTE — Telephone Encounter (Signed)
Prescription refill request for Eliquis received. Indication: Afib Last office visit: 02/07/22 ([place])  Scr: 1.79 (02/08/22)  Age: 71 Weight: 80.6kg  Appropriate dose and refill sent to requested pharmacy

## 2022-04-04 ENCOUNTER — Ambulatory Visit: Payer: PPO | Attending: Cardiology | Admitting: Cardiology

## 2022-04-11 ENCOUNTER — Encounter: Payer: Self-pay | Admitting: Cardiology

## 2022-05-18 ENCOUNTER — Other Ambulatory Visit: Payer: Self-pay | Admitting: Cardiology

## 2022-05-18 NOTE — Telephone Encounter (Signed)
Rx refill sent to pharmacy. 

## 2022-05-30 ENCOUNTER — Telehealth (HOSPITAL_COMMUNITY): Payer: Self-pay | Admitting: Emergency Medicine

## 2022-05-30 NOTE — Telephone Encounter (Signed)
Attempted to call patient regarding upcoming cardiac CT appointment. °Left message on voicemail with name and callback number °Faustina Gebert RN Navigator Cardiac Imaging ° Heart and Vascular Services °[phone] Office °[phone] Cell ° °

## 2022-05-31 ENCOUNTER — Ambulatory Visit (HOSPITAL_BASED_OUTPATIENT_CLINIC_OR_DEPARTMENT_OTHER): Admission: RE | Admit: 2022-05-31 | Payer: PPO | Source: Ambulatory Visit

## 2022-06-01 ENCOUNTER — Telehealth: Payer: Self-pay | Admitting: *Deleted

## 2022-06-01 NOTE — Telephone Encounter (Signed)
Left message to call back yesterday and today to discuss scheduling procedure for next week.   Pt has not gotten his lab work, pt no showed to CT yesterday.

## 2022-06-03 NOTE — Telephone Encounter (Signed)
Left messages again. This time left details that we would be cancelling procedure next week as we have not heard back from him, nor has he completed the pre procedure testing. Asked pt to call office to discuss plan of care going forward.

## 2022-06-07 ENCOUNTER — Encounter (HOSPITAL_COMMUNITY): Admission: RE | Payer: Self-pay | Source: Home / Self Care

## 2022-06-07 ENCOUNTER — Ambulatory Visit (HOSPITAL_COMMUNITY): Admission: RE | Admit: 2022-06-07 | Payer: PPO | Source: Home / Self Care | Admitting: Cardiology

## 2022-06-07 SURGERY — ATRIAL FIBRILLATION ABLATION
Anesthesia: General

## 2022-07-04 DIAGNOSIS — R109 Unspecified abdominal pain: Secondary | ICD-10-CM | POA: Diagnosis not present

## 2022-07-05 ENCOUNTER — Ambulatory Visit (HOSPITAL_COMMUNITY): Payer: PPO | Admitting: Physician Assistant

## 2022-07-06 ENCOUNTER — Telehealth: Payer: Self-pay

## 2022-07-06 NOTE — Telephone Encounter (Signed)
   Pre-operative Risk Assessment    Patient Name: Ronald Stokes  DOB: 01/29/1951 MRN: [id number]      Request for Surgical Clearance    Procedure:   EGD  Date of Surgery:  Clearance 07/22/22                                 Surgeon:Dr. Marcial Pacas [place] Surgeon's Group or Practice Name:  [facility] Phone number:  [phone] Fax number:  [phone]   Type of Clearance Requested:   - Pharmacy:  Hold Apixaban (Eliquis) please advise   Type of Anesthesia:  Not Indicated   Additional requests/questions:    [name]   07/06/2022, 2:27 PM

## 2022-07-06 NOTE — Telephone Encounter (Signed)
Patient with diagnosis of PAF(paroxysmal atrial fibrillation) on Eliquis for anticoagulation.    Procedure: EGD Date of procedure: 07/22/2022   CHA2DS2-VASc Score = 2   This indicates a 2.2% annual risk of stroke. The patient's score is based upon: CHF History: 0 HTN History: 1 Diabetes History: 0 Stroke History: 0 Vascular Disease History: 0 Age Score: 1 Gender Score: 0  CrCl 41 ml/min  Platelet count 171  Per office protocol, patient can hold Eliquis for 2 days prior to procedure.    **This guidance is not considered finalized until pre-operative APP has relayed final recommendations.**

## 2022-07-06 NOTE — Telephone Encounter (Signed)
   Patient Name: Ronald Stokes  DOB: [birth year] MRN: [id number]  Primary Cardiologist: None  Clinical pharmacists have reviewed the patient's past medical history, labs, and current medications as part of preoperative protocol coverage. The following recommendations have been made:  Patient with diagnosis of PAF(paroxysmal atrial fibrillation) on Eliquis for anticoagulation.     Procedure: EGD Date of procedure: 07/22/2022     CHA2DS2-VASc Score = 2   This indicates a 2.2% annual risk of stroke. The patient's score is based upon: CHF History: 0 HTN History: 1 Diabetes History: 0 Stroke History: 0 Vascular Disease History: 0 Age Score: 1 Gender Score: 0   CrCl 41 ml/min  Platelet count 171   Per office protocol, patient can hold Eliquis for 2 days prior to procedure.      I will route this recommendation to the requesting party via Epic fax function and remove from pre-op pool.  Please call with questions.  Napoleon Form, Leodis Rains, NP 07/06/2022, 2:56 PM

## 2022-07-06 NOTE — Telephone Encounter (Signed)
Pharmacy please advise on holding Eliquis prior to EGD scheduled for 07/22/2022. Thank you.

## 2022-07-07 DIAGNOSIS — R1011 Right upper quadrant pain: Secondary | ICD-10-CM | POA: Diagnosis not present

## 2022-07-22 ENCOUNTER — Telehealth: Payer: Self-pay | Admitting: Cardiology

## 2022-07-22 DIAGNOSIS — Z7901 Long term (current) use of anticoagulants: Secondary | ICD-10-CM | POA: Diagnosis not present

## 2022-07-22 DIAGNOSIS — K449 Diaphragmatic hernia without obstruction or gangrene: Secondary | ICD-10-CM | POA: Diagnosis not present

## 2022-07-22 DIAGNOSIS — R1013 Epigastric pain: Secondary | ICD-10-CM | POA: Diagnosis not present

## 2022-07-22 DIAGNOSIS — K219 Gastro-esophageal reflux disease without esophagitis: Secondary | ICD-10-CM | POA: Diagnosis not present

## 2022-07-22 DIAGNOSIS — I4891 Unspecified atrial fibrillation: Secondary | ICD-10-CM | POA: Diagnosis not present

## 2022-07-22 DIAGNOSIS — Z79899 Other long term (current) drug therapy: Secondary | ICD-10-CM | POA: Diagnosis not present

## 2022-07-22 DIAGNOSIS — Z87891 Personal history of nicotine dependence: Secondary | ICD-10-CM | POA: Diagnosis not present

## 2022-07-22 DIAGNOSIS — C61 Malignant neoplasm of prostate: Secondary | ICD-10-CM | POA: Diagnosis not present

## 2022-07-22 NOTE — Telephone Encounter (Signed)
Called pt- left message to call regarding message

## 2022-07-22 NOTE — Telephone Encounter (Signed)
Patient just had a endoscopy procedure at [facility] and is currently in Afib. CB # [phone]

## 2022-07-25 ENCOUNTER — Telehealth: Payer: Self-pay | Admitting: Cardiology

## 2022-07-25 NOTE — Telephone Encounter (Signed)
Spoke with [facility] caregiver - she stated that the pt had an Endoscopy and they said he was in a-fib and should follow up with cardiology. Advised to come for Nurse Visit and Dr. Agustin Cree will make decision on the next step. They agreed and sent to front desk to schedule.

## 2022-07-25 NOTE — Telephone Encounter (Signed)
Left message for patient to call office to schedule Nurse visit for EKG- For A-Fib- They can send to [place] in [place] to look at if its on a day we are there per [facility]

## 2022-07-25 NOTE — Telephone Encounter (Signed)
Ronald Stokes, returned call from Friday.

## 2022-08-03 ENCOUNTER — Ambulatory Visit: Payer: PPO | Attending: Cardiology

## 2022-08-03 VITALS — BP 128/72 | HR 56 | Wt 187.2 lb

## 2022-08-03 DIAGNOSIS — I480 Paroxysmal atrial fibrillation: Secondary | ICD-10-CM | POA: Diagnosis not present

## 2022-08-03 NOTE — Progress Notes (Signed)
    Nurse Visit   Date of Encounter: 08/03/2022 ID: Ronald Stokes, DOB [birth year], MRN [id number]  PCP:  Serita Grammes, [place] Prov[facility] Cardiologist:  Jenne Campus, MD Electrophysiologist:  Constance Haw, MD      Visit Details   VS:  BP 128/72 (BP Location: Right Arm, Patient Position: Sitting, Cuff Size: Normal)   Pulse (!) 56   Wt 187 lb 3.2 oz (84.9 kg)   BMI 26.86 kg/m  , BMI Body mass index is 26.86 kg/m.  Wt Readings from Last 3 Encounters:  08/03/22 187 lb 3.2 oz (84.9 kg)  02/07/22 177 lb 9.6 oz (80.6 kg)  12/22/21 179 lb 6.4 oz (81.4 kg)     Reason for visit: Perform EKG to see if patient in A-fib. Performed today: Vitals, EKG, Provider consulted and Education Changes (medications, testing, etc.) : None Length of Visit: 20 minutes    Medications Adjustments/Labs and Tests Ordered: No orders of the defined types were placed in this encounter.  No orders of the defined types were placed in this encounter.    Signed, Louie Casa, RN  08/03/2022 1:34 PM

## 2022-08-04 NOTE — Addendum Note (Signed)
Addended by: Darius Bump B on: 08/04/2022 03:41 PM   Modules accepted: Orders

## 2022-08-08 DIAGNOSIS — C44222 Squamous cell carcinoma of skin of right ear and external auricular canal: Secondary | ICD-10-CM | POA: Diagnosis not present

## 2022-08-08 DIAGNOSIS — L821 Other seborrheic keratosis: Secondary | ICD-10-CM | POA: Diagnosis not present

## 2022-08-08 DIAGNOSIS — L578 Other skin changes due to chronic exposure to nonionizing radiation: Secondary | ICD-10-CM | POA: Diagnosis not present

## 2022-08-08 DIAGNOSIS — L570 Actinic keratosis: Secondary | ICD-10-CM | POA: Diagnosis not present

## 2022-08-12 DIAGNOSIS — N1831 Chronic kidney disease, stage 3a: Secondary | ICD-10-CM | POA: Diagnosis not present

## 2022-08-16 DIAGNOSIS — E785 Hyperlipidemia, unspecified: Secondary | ICD-10-CM | POA: Diagnosis not present

## 2022-08-16 DIAGNOSIS — Z8546 Personal history of malignant neoplasm of prostate: Secondary | ICD-10-CM | POA: Diagnosis not present

## 2022-08-16 DIAGNOSIS — N1831 Chronic kidney disease, stage 3a: Secondary | ICD-10-CM | POA: Diagnosis not present

## 2022-08-18 DIAGNOSIS — N1831 Chronic kidney disease, stage 3a: Secondary | ICD-10-CM | POA: Diagnosis not present

## 2022-08-18 DIAGNOSIS — N189 Chronic kidney disease, unspecified: Secondary | ICD-10-CM | POA: Diagnosis not present

## 2022-08-18 DIAGNOSIS — N281 Cyst of kidney, acquired: Secondary | ICD-10-CM | POA: Diagnosis not present

## 2022-08-23 DIAGNOSIS — R1011 Right upper quadrant pain: Secondary | ICD-10-CM | POA: Diagnosis not present

## 2022-09-05 ENCOUNTER — Ambulatory Visit: Payer: PPO | Admitting: Cardiology

## 2022-09-05 ENCOUNTER — Other Ambulatory Visit: Payer: Self-pay | Admitting: *Deleted

## 2022-09-05 DIAGNOSIS — I480 Paroxysmal atrial fibrillation: Secondary | ICD-10-CM

## 2022-09-05 MED ORDER — APIXABAN 5 MG PO TABS: ORAL_TABLET | ORAL | 5 refills | Status: DC

## 2022-09-05 NOTE — Telephone Encounter (Signed)
Eliquis 5mg  refill request received. Patient is 72 years old, weight-84.9kg, Crea-1.79 on 02/08/22 via [place] from [facility], [place], and last seen by [facility] on 02/07/22. Dose is appropriate based on dosing criteria. Will send in refill to requested pharmacy.

## 2022-10-18 DIAGNOSIS — M25562 Pain in left knee: Secondary | ICD-10-CM | POA: Diagnosis not present

## 2022-10-18 DIAGNOSIS — M1712 Unilateral primary osteoarthritis, left knee: Secondary | ICD-10-CM | POA: Diagnosis not present

## 2022-10-18 DIAGNOSIS — M25462 Effusion, left knee: Secondary | ICD-10-CM | POA: Diagnosis not present

## 2022-10-18 DIAGNOSIS — G8929 Other chronic pain: Secondary | ICD-10-CM | POA: Diagnosis not present

## 2022-10-18 DIAGNOSIS — M21162 Varus deformity, not elsewhere classified, left knee: Secondary | ICD-10-CM | POA: Diagnosis not present

## 2022-11-02 ENCOUNTER — Other Ambulatory Visit: Payer: Self-pay | Admitting: Cardiology

## 2023-02-07 ENCOUNTER — Other Ambulatory Visit: Payer: Self-pay | Admitting: Cardiology

## 2023-02-13 DIAGNOSIS — K219 Gastro-esophageal reflux disease without esophagitis: Secondary | ICD-10-CM | POA: Diagnosis not present

## 2023-02-13 DIAGNOSIS — I499 Cardiac arrhythmia, unspecified: Secondary | ICD-10-CM | POA: Diagnosis not present

## 2023-02-13 DIAGNOSIS — I4891 Unspecified atrial fibrillation: Secondary | ICD-10-CM | POA: Diagnosis not present

## 2023-02-14 ENCOUNTER — Telehealth: Payer: Self-pay

## 2023-02-14 NOTE — Telephone Encounter (Signed)
   [facility] Pre-operative Risk Assessment    Request for surgical clearance:  What type of surgery is being performed? Colonoscopy   When is this surgery scheduled? TBD   What type of clearance is required (medical clearance vs. Pharmacy clearance to hold med vs. Both)? Pharmacy  Are there any medications that need to be held prior to surgery and how long?Eliquis hold 2 days prior   Practice name and name of physician performing surgery? Dr. Laurell Roof at [facility]   What is your office phone number: [phone]    7.   What is your office fax number: [phone]  8.   Anesthesia type (None, local, MAC, general) ? Not specified   Ronald Stokes 02/14/2023, 11:29 AM  _________________________________________________________________   (provider comments below)

## 2023-02-15 ENCOUNTER — Encounter: Payer: Self-pay | Admitting: *Deleted

## 2023-02-15 NOTE — Telephone Encounter (Signed)
   Name: Ronald Stokes  DOB: Aug 02, 1950  MRN: [id number]  Primary Cardiologist: None  Chart reviewed as part of pre-operative protocol coverage. Because of Thomos Stasiak past medical history and time since last visit, he will require a follow-up in-office visit in order to better assess preoperative cardiovascular risk.  Pre-op covering staff: - Please schedule appointment and call patient to inform them. If patient already had an upcoming appointment within acceptable timeframe, please add "pre-op clearance" to the appointment notes so provider is aware. - Please contact requesting surgeon's office via preferred method (i.e, phone, fax) to inform them of need for appointment prior to surgery.  This message will also be routed to pharmacy pool for input on holding Eliquis as requested below so that this information is available to the clearing provider at time of patient's appointment.   Carlos Levering, NP  02/15/2023, 7:36 AM

## 2023-02-15 NOTE — Telephone Encounter (Signed)
Pt has been scheduled to see Wallis Bamberg, NP, tomorrow, 02/16/23 3:35, clearance will be addressed at that time.  Will route to the requesting surgeon's office to make them aware.

## 2023-02-15 NOTE — Telephone Encounter (Signed)
Please advise holding Eliquis prior to colonoscopy.  Thank you!  DW  

## 2023-02-15 NOTE — Telephone Encounter (Signed)
Patient with diagnosis of atrial fibrillation on Eliquis for anticoagulation.    Procedure: colonoscopy Date of procedure: TBD   CHA2DS2-VASc Score = 2   This indicates a 2.2% annual risk of stroke. The patient's score is based upon: CHF History: 0 HTN History: 1 Diabetes History: 0 Stroke History: 0 Vascular Disease History: 0 Age Score: 1 Gender Score: 0  CrCl 53 Platelet count - greater than 1 year since last CBC  Patient will need updated CBC for final clearance.  If platelet count WNL, then:  Per office protocol, patient can hold Eliquis for 2 days prior to procedure.   Patient will not need bridging with Lovenox (enoxaparin) around procedure.  **This guidance is not considered finalized until pre-operative APP has relayed final recommendations.**

## 2023-02-16 ENCOUNTER — Encounter: Payer: Self-pay | Admitting: Cardiology

## 2023-02-16 ENCOUNTER — Ambulatory Visit: Payer: PPO | Attending: Cardiology | Admitting: Cardiology

## 2023-02-16 ENCOUNTER — Telehealth: Payer: Self-pay

## 2023-02-16 VITALS — BP 100/70 | HR 122 | Ht 70.0 in | Wt 188.0 lb

## 2023-02-16 DIAGNOSIS — I10 Essential (primary) hypertension: Secondary | ICD-10-CM

## 2023-02-16 DIAGNOSIS — D6869 Other thrombophilia: Secondary | ICD-10-CM

## 2023-02-16 DIAGNOSIS — Z79899 Other long term (current) drug therapy: Secondary | ICD-10-CM | POA: Diagnosis not present

## 2023-02-16 DIAGNOSIS — E782 Mixed hyperlipidemia: Secondary | ICD-10-CM | POA: Diagnosis not present

## 2023-02-16 DIAGNOSIS — I456 Pre-excitation syndrome: Secondary | ICD-10-CM

## 2023-02-16 DIAGNOSIS — Z9889 Other specified postprocedural states: Secondary | ICD-10-CM | POA: Diagnosis not present

## 2023-02-16 DIAGNOSIS — I480 Paroxysmal atrial fibrillation: Secondary | ICD-10-CM | POA: Diagnosis not present

## 2023-02-16 DIAGNOSIS — Z8679 Personal history of other diseases of the circulatory system: Secondary | ICD-10-CM | POA: Diagnosis not present

## 2023-02-16 MED ORDER — AMIODARONE HCL 200 MG PO TABS: 200.0000 mg | ORAL_TABLET | Freq: Two times a day (BID) | ORAL | 3 refills | Status: DC

## 2023-02-16 NOTE — Patient Instructions (Addendum)
Medication Instructions:  Your physician has recommended you make the following change in your medication:  Increase Amiodarone to 200 mg two times daily Take another Amiodarone tonight and then start taking two times daily   *If you need a refill on your cardiac medications before your next appointment, please call your pharmacy*   Lab Work: Your physician recommends that you return for lab work in: Today for BMP, CBC, TSH, T3 & T4, Magnesium  If you have labs (blood work) drawn today and your tests are completely normal, you will receive your results only by: MyChart Message (if you have MyChart) OR A paper copy in the mail If you have any lab test that is abnormal or we need to change your treatment, we will call you to review the results.   Testing/Procedures: NONE   Follow-Up: At [facility], you and your health needs are our priority.  As part of our continuing mission to provide you with exceptional heart care, we have created designated Provider Care Teams.  These Care Teams include your primary Cardiologist (physician) and Advanced Practice Providers (APPs -  Physician Assistants and Nurse Practitioners) who all work together to provide you with the care you need, when you need it.  We recommend signing up for the patient portal called "MyChart".  Sign up information is provided on this After Visit Summary.  MyChart is used to connect with patients for Virtual Visits (Telemedicine).  Patients are able to view lab/test results, encounter notes, upcoming appointments, etc.  Non-urgent messages can be sent to your provider as well.   To learn more about what you can do with MyChart, go to [email or web address].    Your next appointment:   3 Weeks  Provider:   [name], MD   Or Wallis Bamberg, NP Other Instructions

## 2023-02-16 NOTE — Progress Notes (Signed)
Cardiology Office Note:  .   Date:  02/16/2023  ID:  Ronald Stokes, DOB [birth year], MRN [id number] PCP: [name], MD  [facility] Providers Cardiologist:  [name], MD Electrophysiologist:  Regan Lemming, MD    History of Present Illness: .   Ronald Stokes is a 72 y.o. male with a past medical history of paroxysmal atrial fibrillation on amiodarone therapy, WPW, hypertrophic cardiomyopathy, hypertension, GERD, hypothyroidism, history of prostate cancer, CKD, dyslipidemia.  Evaluated by Dr. Elberta Fortis in August 2023, was doing well from a cardiac perspective.  He reported intermittent episodes of atrial fibrillation, had recently wore a monitor that revealed his A-fib burden was 24%.  He was wanting a different option other than amiodarone, plans were made to proceed with ablation however it appears the patient canceled this procedure.  He presents today for follow-up of his atrial fibrillation.  Since he was last evaluated by our practice, he has unfortunately lost his wife, this is caused a great deal of stress for him.  He apparently also stopped all of his medications in an attempt to get off of them, however states he saw a provider earlier in the week who advised him to restart all of his medications.  He is in atrial fibrillation today rate is uncontrolled, he is feeling poorly, cites no energy.  He is seemingly unaware of the palpitations or elevated heart rate.  There seems to be some confusion with his medication regimen as he is not entirely clear what he is taking.  We discussed the atrial fibrillation ablation and was he still interested in proceeding with that, at this not appear that he is ready to make a decision on that however advised him he needed to be evaluated by EP as soon as possible. He denies chest pain, palpitations, dyspnea, pnd, orthopnea, n, v, dizziness, syncope, edema, weight gain, or early satiety.   ROS: Review of Systems  Constitutional:   Positive for malaise/fatigue.  All other systems reviewed and are negative.    Studies Reviewed: Marland Kitchen   EKG Interpretation Date/Time:  Thursday February 16 2023 15:40:43 EDT Ventricular Rate:  122 PR Interval:    QRS Duration:  78 QT Interval:  362 QTC Calculation: 515 R Axis:   -4  Text Interpretation: Atrial fibrillation with rapid ventricular response Nonspecific ST and T wave abnormality Abnormal ECG No previous ECGs available Confirmed by Wallis Bamberg [phone]) on 02/16/2023 4:23:00 PM    Cardiac Studies & Procedures     STRESS TESTS  MYOCARDIAL PERFUSION IMAGING 07/02/2020  Narrative  The left ventricular ejection fraction is mildly decreased (45-54%).  Nuclear stress EF: 54%.  There was no ST segment deviation noted during stress.  The study is normal.  This is a low risk study.   ECHOCARDIOGRAM  ECHOCARDIOGRAM COMPLETE 09/24/2021  Narrative ECHOCARDIOGRAM REPORT    Patient Name:   Ronald Stokes Date of Exam: 09/24/2021 Medical Rec #:  [id number]   Height:       69.0 in Accession #:    [id number]  Weight:       179.0 lb Date of Birth:  Sep 23, 1950   BSA:          1.971 m Patient Age:    70 years    BP:           156/90 mmHg Patient Gender: M           HR:           96 bpm. Exam Location:  [place]  Procedure: 2D Echo, Cardiac Doppler, Color Doppler and Strain Analysis  Indications:    Paroxysmal atrial fibrillation (HCC) [I48.0 (ICD-10-CM)]; Essential hypertension [I10 (ICD-10-CM)]; Apical variant hypertrophic cardiomyopathy (HCC) [I42.2 (ICD-10-CM)]; Mixed hyperlipidemia [E78.2 (ICD-10-CM)]  History:        Patient has prior history of Echocardiogram examinations, most recent 07/23/2020. Cardiomyopathy; Arrythmias:Atrial Fibrillation. Wolff-Parkinson-White syndrome.  Sonographer:    Margreta Journey RDCS Referring Phys: [id number] ROBERT J KRASOWSKI  IMPRESSIONS   1. Known apical variant of hypertrphic cardiomyopathy. Left ventricular ejection fraction, by  estimation, is 50 to 55%. The left ventricle has low normal function. The left ventricle has no regional wall motion abnormalities. There is moderate left ventricular hypertrophy of the apical segment. Left ventricular diastolic parameters are indeterminate. 2. Right ventricular systolic function is normal. The right ventricular size is normal. 3. The mitral valve is normal in structure. No evidence of mitral valve regurgitation. No evidence of mitral stenosis. 4. The aortic valve is normal in structure. Aortic valve regurgitation is not visualized. Aortic valve sclerosis/calcification is present, without any evidence of aortic stenosis. 5. The inferior vena cava is normal in size with greater than 50% respiratory variability, suggesting right atrial pressure of 3 mmHg.  FINDINGS Left Ventricle: Known apical variant of hypertrphic cardiomyopathy. Left ventricular ejection fraction, by estimation, is 50 to 55%. The left ventricle has low normal function. The left ventricle has no regional wall motion abnormalities. The left ventricular internal cavity size was normal in size. There is moderate left ventricular hypertrophy of the apical segment. Left ventricular diastolic parameters are indeterminate.  Right Ventricle: The right ventricular size is normal. No increase in right ventricular wall thickness. Right ventricular systolic function is normal.  Left Atrium: Left atrial size was normal in size.  Right Atrium: Right atrial size was normal in size.  Pericardium: There is no evidence of pericardial effusion.  Mitral Valve: The mitral valve is normal in structure. No evidence of mitral valve regurgitation. No evidence of mitral valve stenosis.  Tricuspid Valve: The tricuspid valve is normal in structure. Tricuspid valve regurgitation is not demonstrated. No evidence of tricuspid stenosis.  Aortic Valve: The aortic valve is normal in structure. Aortic valve regurgitation is not visualized.  Aortic valve sclerosis/calcification is present, without any evidence of aortic stenosis.  Pulmonic Valve: The pulmonic valve was normal in structure. Pulmonic valve regurgitation is not visualized. No evidence of pulmonic stenosis.  Aorta: The aortic root is normal in size and structure.  Venous: The inferior vena cava is normal in size with greater than 50% respiratory variability, suggesting right atrial pressure of 3 mmHg.  IAS/Shunts: No atrial level shunt detected by color flow Doppler.   LEFT VENTRICLE PLAX 2D LVIDd:         4.10 cm   Diastology LVIDs:         2.80 cm   LV e' medial:    7.51 cm/s LV PW:         1.00 cm   LV E/e' medial:  9.8 LV IVS:        1.30 cm   LV e' lateral:   12.30 cm/s LVOT diam:     2.00 cm   LV E/e' lateral: 6.0 LV SV:         45 LV SV Index:   23 LVOT Area:     3.14 cm   RIGHT VENTRICLE RV Basal diam:  2.70 cm RV S prime:     10.10 cm/s TAPSE (M-mode): 2.4  cm  LEFT ATRIUM             Index        RIGHT ATRIUM           Index LA diam:        3.50 cm 1.78 cm/m   RA Area:     13.30 cm LA Vol (A2C):   63.9 ml 32.42 ml/m  RA Volume:   28.80 ml  14.61 ml/m LA Vol (A4C):   58.2 ml 29.53 ml/m LA Biplane Vol: 62.8 ml 31.86 ml/m AORTIC VALVE LVOT Vmax:   74.20 cm/s LVOT Vmean:  54.400 cm/s LVOT VTI:    0.142 m  AORTA Ao Root diam: 3.70 cm Ao Asc diam:  3.10 cm  MITRAL VALVE MV Area (PHT): 4.24 cm    SHUNTS MV Decel Time: 179 msec    Systemic VTI:  0.14 m MV E velocity: 73.70 cm/s  Systemic Diam: 2.00 cm  [name] MD Electronically signed by [name] MD Signature Date/Time: 09/24/2021/12:41:43 PM    Final    MONITORS  LONG TERM MONITOR (3-14 DAYS) 10/13/2021  Narrative Patch Wear Time:  14 days and 0 hours (2023-04-04T15:33:07-398 to 2023-04-18T15:33:11-398)  Patient had a min HR of 44 bpm, max HR of 160 bpm, and avg HR of 66 bpm. Predominant underlying rhythm was Sinus Rhythm. Atrial Fibrillation occurred  (24% burden), ranging from 56-160 bpm (avg of 90 bpm), the longest lasting 7 hours 33 mins with an avg rate of 92 bpm. Isolated SVEs were rare (<1.0%), SVE Couplets were rare (<1.0%), and SVE Triplets were rare (<1.0%). Isolated VEs were rare (<1.0%), and no VE Couplets or VE Triplets were present. Ventricular Bigeminy was present.  Summary and conclusions: Paroxysmal atrial fibrillation with total burden of 24%, average heart rate of 90, longest episode 7 hours 33 minutes.           Risk Assessment/Calculations:    CHA2DS2-VASc Score = 2   This indicates a 2.2% annual risk of stroke. The patient's score is based upon: CHF History: 0 HTN History: 1 Diabetes History: 0 Stroke History: 0 Vascular Disease History: 0 Age Score: 1 Gender Score: 0            Physical Exam:   VS:  BP 100/70 (BP Location: Left Arm, Patient Position: Sitting, Cuff Size: Normal)   Pulse (!) 122   Ht 5\' 10"  (1.778 m)   Wt 188 lb (85.3 kg)   SpO2 97%   BMI 26.98 kg/m    Wt Readings from Last 3 Encounters:  02/16/23 188 lb (85.3 kg)  08/03/22 187 lb 3.2 oz (84.9 kg)  02/07/22 177 lb 9.6 oz (80.6 kg)    GEN: Well nourished, well developed in no acute distress NECK: No JVD; No carotid bruits CARDIAC: Irregularly irregular, no murmurs, rubs, gallops RESPIRATORY:  Clear to auscultation without rales, wheezing or rhonchi  ABDOMEN: Soft, non-tender, non-distended EXTREMITIES:  No edema; No deformity   ASSESSMENT AND PLAN: .   Atrial fibrillation/hypercoagulable state/on amiodarone therapy-he is in atrial fibrillation today, rate is uncontrolled at 122 however his blood pressure is marginally low.  Will increase his amiodarone to 200 mg twice daily.  Will have him return in 5 days for nurse visit for repeat EKG.  Will have him follow-up with EP soon as possible to discuss ablation therapy.  Will check CBC, BMET, TSH panel as well as magnesium. WPW-s/p ablation with Dr. Terri Skains  Dyslipidemia-supposed to be on  Lipitor 40  mg daily and Zetia 10 mg daily however he recently stopped all of his medications, is not entirely clear what he is currently taking.  Will address at next OV. Hypothyroidism-will check thyroid panel, again not clear if he is currently taking his levothyroxine or not.       Dispo: Labs per above, refer to EP.  Return in 5 days for nurse visit with EKG.  Signed, Flossie Dibble, NP

## 2023-02-17 LAB — BASIC METABOLIC PANEL WITH GFR
BUN/Creatinine Ratio: 12 (ref 10–24)
BUN: 20 mg/dL (ref 8–27)
CO2: 22 mmol/L (ref 20–29)
Calcium: 9.6 mg/dL (ref 8.6–10.2)
Chloride: 102 mmol/L (ref 96–106)
Creatinine, Ser: 1.61 mg/dL — ABNORMAL HIGH (ref 0.76–1.27)
Glucose: 73 mg/dL (ref 70–99)
Potassium: 4.5 mmol/L (ref 3.5–5.2)
Sodium: 139 mmol/L (ref 134–144)
eGFR: 45 mL/min/1.73 — ABNORMAL LOW

## 2023-02-17 LAB — T4, FREE: Free T4: 0.43 ng/dL — ABNORMAL LOW (ref 0.82–1.77)

## 2023-02-17 LAB — CBC WITH DIFFERENTIAL/PLATELET
Basophils Absolute: 0 x10E3/uL (ref 0.0–0.2)
Basos: 1 %
EOS (ABSOLUTE): 0.1 x10E3/uL (ref 0.0–0.4)
Eos: 2 %
Hematocrit: 44 % (ref 37.5–51.0)
Hemoglobin: 14.6 g/dL (ref 13.0–17.7)
Immature Grans (Abs): 0 x10E3/uL (ref 0.0–0.1)
Immature Granulocytes: 0 %
Lymphocytes Absolute: 0.9 x10E3/uL (ref 0.7–3.1)
Lymphs: 15 %
MCH: 32.4 pg (ref 26.6–33.0)
MCHC: 33.2 g/dL (ref 31.5–35.7)
MCV: 98 fL — ABNORMAL HIGH (ref 79–97)
Monocytes Absolute: 0.6 x10E3/uL (ref 0.1–0.9)
Monocytes: 10 %
Neutrophils Absolute: 4.4 x10E3/uL (ref 1.4–7.0)
Neutrophils: 72 %
Platelets: 235 x10E3/uL (ref 150–450)
RBC: 4.51 x10E6/uL (ref 4.14–5.80)
RDW: 13.1 % (ref 11.6–15.4)
WBC: 6.1 x10E3/uL (ref 3.4–10.8)

## 2023-02-17 LAB — T3, FREE: T3, Free: 1.9 pg/mL — ABNORMAL LOW (ref 2.0–4.4)

## 2023-02-17 LAB — TSH: TSH: 91.4 u[IU]/mL — ABNORMAL HIGH (ref 0.450–4.500)

## 2023-02-17 LAB — MAGNESIUM: Magnesium: 2.5 mg/dL — ABNORMAL HIGH (ref 1.6–2.3)

## 2023-02-21 ENCOUNTER — Ambulatory Visit: Payer: PPO

## 2023-02-21 ENCOUNTER — Telehealth: Payer: Self-pay | Admitting: *Deleted

## 2023-02-21 ENCOUNTER — Ambulatory Visit: Payer: PPO | Attending: Cardiology | Admitting: Cardiology

## 2023-02-21 VITALS — BP 114/62 | HR 104 | Ht 70.0 in | Wt 187.4 lb

## 2023-02-21 DIAGNOSIS — Z79899 Other long term (current) drug therapy: Secondary | ICD-10-CM

## 2023-02-21 DIAGNOSIS — I422 Other hypertrophic cardiomyopathy: Secondary | ICD-10-CM

## 2023-02-21 DIAGNOSIS — I480 Paroxysmal atrial fibrillation: Secondary | ICD-10-CM

## 2023-02-21 DIAGNOSIS — I10 Essential (primary) hypertension: Secondary | ICD-10-CM | POA: Diagnosis not present

## 2023-02-21 DIAGNOSIS — E039 Hypothyroidism, unspecified: Secondary | ICD-10-CM

## 2023-02-21 LAB — COMPREHENSIVE METABOLIC PANEL
ALT: 17 IU/L (ref 0–44)
AST: 22 IU/L (ref 0–40)
Albumin: 4.4 g/dL (ref 3.8–4.8)
Alkaline Phosphatase: 71 IU/L (ref 44–121)
BUN/Creatinine Ratio: 10 (ref 10–24)
BUN: 18 mg/dL (ref 8–27)
Bilirubin Total: 0.4 mg/dL (ref 0.0–1.2)
CO2: 26 mmol/L (ref 20–29)
Calcium: 9.3 mg/dL (ref 8.6–10.2)
Chloride: 105 mmol/L (ref 96–106)
Creatinine, Ser: 1.84 mg/dL — ABNORMAL HIGH (ref 0.76–1.27)
Globulin, Total: 2.9 g/dL (ref 1.5–4.5)
Glucose: 96 mg/dL (ref 70–99)
Potassium: 5.1 mmol/L (ref 3.5–5.2)
Sodium: 141 mmol/L (ref 134–144)
Total Protein: 7.3 g/dL (ref 6.0–8.5)
eGFR: 38 mL/min/{1.73_m2} — ABNORMAL LOW (ref >59)

## 2023-02-21 MED ORDER — METOPROLOL SUCCINATE ER 25 MG PO TB24: 25.0000 mg | ORAL_TABLET | Freq: Every day | ORAL | 3 refills | Status: DC

## 2023-02-21 NOTE — Telephone Encounter (Signed)
Spoke with pt about lab results. He stated he has appt with [name] this week to discuss Thyroid results. Pt verbalized understanding and had no further questions.

## 2023-02-21 NOTE — Progress Notes (Signed)
Cardiology Office Note:    Date:  02/21/2023   ID:  [name], DOB 09/02/1950, MRN [id number]  PCP:  [name], MD  Cardiologist:  Garwin Brothers, MD   Referring MD: [name], MD    ASSESSMENT:    1. Paroxysmal atrial fibrillation (HCC)   2. Apical variant hypertrophic cardiomyopathy (HCC)   3. Essential hypertension   4. PAF (paroxysmal atrial fibrillation) (HCC)   5. On amiodarone therapy   6. Hypothyroidism, unspecified type    PLAN:    In order of problems listed above:  Primary prevention stressed with the patient.  Importance of compliance with diet medication stressed and patient verbalized standing. Atrial fibrillation: I discussed this with him at length.  In view of marked hypothyroidism I will stop amiodarone ASAP.  I told my nurse to get in touch with patient's primary care and speak to their nurse about these issues and the patient will need to be evaluated by them ASAP.  I discussed this with the patient and he vocalized understanding.I discussed with the patient atrial fibrillation, disease process. Management and therapy including rate and rhythm control, anticoagulation benefits and potential risks were discussed extensively with the patient. Patient had multiple questions which were answered to patient's satisfaction.  I also discussed ambulation that will be given an appointment with Dr. Elberta Fortis.  I have now initiated on small dose of beta-blocker and he will keep a track of his pulse blood pressure and will do a 2-week monitor for him.  Atrial fibrillation ablation needs to be considered. Amiodarone therapy: Benefits risks explained how.  In view of hypothyroidism have discontinued amiodarone.  Will do LFTs today. Apical variant of hypertrophic cardiomyopathy: Stable at this time no symptoms such as syncope or dizziness or palpitations.  We will continue to monitor.  2-week monitoring will help this also. He will be seen back in follow-up  appointment end of September.  Patient had multiple questions which were answered to satisfaction.   Medication Adjustments/Labs and Tests Ordered: Current medicines are reviewed at length with the patient today.  Concerns regarding medicines are outlined above.  Orders Placed This Encounter  Procedures   EKG 12-Lead   No orders of the defined types were placed in this encounter.    No chief complaint on file.    History of Present Illness:    Ronald Stokes is a 72 y.o. male.  Patient has past medical history of apical variant of hypertrophic cardiomyopathy, paroxysmal atrial fibrillation, essential hypertension.  Hypothyroidism and the patient has history of amiodarone therapy.  She was recently evaluated by her nurse practitioner.  I noticed that TSH is markedly elevated with greater than 91.  He is lost his wife in December and he is sad about it.  Denies any chest pain orthopnea or PND.  At the time of my evaluation, the patient is alert awake oriented and in no distress.  Past Medical History:  Diagnosis Date   Apical variant hypertrophic cardiomyopathy (HCC) 02/09/2015   Atrial fibrillation (HCC)    Atypical chest pain 04/18/2018   CKD (chronic kidney disease)    Essential hypertension 02/09/2015   GERD (gastroesophageal reflux disease)    History of hypertrophic cardiomyopathy 04/18/2018   Hyperlipidemia    Hypothyroidism    On amiodarone therapy 02/09/2015   PAF (paroxysmal atrial fibrillation) (HCC) 02/09/2015   Paroxysmal atrial fibrillation (HCC) 04/18/2018   Personal history of radiation therapy 04/18/2018   Prostate cancer (HCC) 04/18/2018   Status post ablation  of atrial fibrillation 04/18/2018   Syncope    WPW (Wolff-Parkinson-White syndrome)     Past Surgical History:  Procedure Laterality Date   COLONOSCOPY  05/28/2014   Colonic polyp status post polypectomy. Moderate sigmoid diverticulosis. Limited examination due to quality of preparation. \    ESOPHAGOGASTRODUODENOSCOPY  12/18/2014   Normal EGD.    HERNIA REPAIR     ROTATOR CUFF REPAIR     WPW corrective surgery      Current Medications: Current Meds  Medication Sig   acetaminophen (TYLENOL) 500 MG tablet Take 500 mg by mouth every 8 (eight) hours as needed for mild pain or moderate pain.   amiodarone (PACERONE) 200 MG tablet Take 1 tablet (200 mg total) by mouth 2 (two) times daily.   apixaban (ELIQUIS) 5 MG TABS tablet TAKE ONE TABLET BY MOUTH EVERY MORNING and TAKE ONE TABLET BY MOUTH EVERY EVENING   zolpidem (AMBIEN) 10 MG tablet Take 10 mg by mouth at bedtime.     Allergies:   Patient has no known allergies.   Social History   Socioeconomic History   Marital status: Married    Spouse name: Not on file   Number of children: Not on file   Years of education: Not on file   Highest education level: Not on file  Occupational History   Not on file  Tobacco Use   Smoking status: Former    Types: Cigars   Smokeless tobacco: Former  Building services engineer status: Never Used  Substance and Sexual Activity   Alcohol use: Yes    Comment: ocassionally, beer once in a while   Drug use: Never   Sexual activity: Not on file  Other Topics Concern   Not on file  Social History Narrative   Not on file   Social Determinants of Health   Financial Resource Strain: Not on file  Food Insecurity: Not on file  Transportation Needs: Not on file  Physical Activity: Not on file  Stress: Not on file  Social Connections: Not on file     Family History: The patient's family history includes Heart disease in his father. There is no history of Colon cancer or Esophageal cancer.  ROS:   Please see the history of present illness.    All other systems reviewed and are negative.  EKGs/Labs/Other Studies Reviewed:    The following studies were reviewed today:  .Marland Kitchen      Recent Labs: 02/16/2023: BUN 20; Creatinine, Ser 1.61; Hemoglobin 14.6; Magnesium 2.5; Platelets 235;  Potassium 4.5; Sodium 139; TSH 91.400  Recent Lipid Panel    Component Value Date/Time   CHOL 247 (H) 09/21/2021 1552   TRIG 215 (H) 09/21/2021 1552   HDL 52 09/21/2021 1552   CHOLHDL 4.8 09/21/2021 1552   LDLCALC 156 (H) 09/21/2021 1552    Physical Exam:    VS:  BP 114/62   Pulse (!) 104   Ht 5\' 10"  (1.778 m)   Wt 187 lb 6.4 oz (85 kg)   SpO2 95%   BMI 26.89 kg/m     Wt Readings from Last 3 Encounters:  02/21/23 187 lb 6.4 oz (85 kg)  02/16/23 188 lb (85.3 kg)  08/03/22 187 lb 3.2 oz (84.9 kg)     GEN: Patient is in no acute distress HEENT: Normal NECK: No JVD; No carotid bruits LYMPHATICS: No lymphadenopathy CARDIAC: Hear sounds regular, 2/6 systolic murmur at the apex. RESPIRATORY:  Clear to auscultation without rales, wheezing or rhonchi  ABDOMEN: Soft, non-tender, non-distended MUSCULOSKELETAL:  No edema; No deformity  SKIN: Warm and dry NEUROLOGIC:  Alert and oriented x 3 PSYCHIATRIC:  Normal affect   Signed, Garwin Brothers, MD  02/21/2023 9:44 AM    [facility]

## 2023-02-21 NOTE — Telephone Encounter (Signed)
-----   Message from Flossie Dibble sent at 02/17/2023  7:57 AM EDT ----- Mr. Finerty,  Chemistry is stable. Blood count is stable. Thyroid is very high, meaning your thyroid is low. You need to call your PCP today for follow up. I know you stopped your meds and then restarted, but you need to follow up with Dr. York Grice. I will forward the results to her office.

## 2023-02-21 NOTE — Patient Instructions (Addendum)
Please keep a BP log for 2 weeks and send by MyChart or mail.  Blood Pressure Record Sheet To take your blood pressure, you will need a blood pressure machine. You can buy a blood pressure machine (blood pressure monitor) at your clinic, drug store, or online. When choosing one, consider: An automatic monitor that has an arm cuff. A cuff that wraps snugly around your upper arm. You should be able to fit only one finger between your arm and the cuff. A device that stores blood pressure reading results. Do not choose a monitor that measures your blood pressure from your wrist or finger. Follow your health care provider's instructions for how to take your blood pressure. To use this form: Get one reading in the morning (a.m.) 1-2 hours after you take any medicines. Get one reading in the evening (p.m.) before supper.   Blood pressure log Date: _______________________  a.m. _____________________(1st reading) HR___________            p.m. _____________________(2nd reading) HR__________  Date: _______________________  a.m. _____________________(1st reading) HR___________            p.m. _____________________(2nd reading) HR__________  Date: _______________________  a.m. _____________________(1st reading) HR___________            p.m. _____________________(2nd reading) HR__________  Date: _______________________  a.m. _____________________(1st reading) HR___________            p.m. _____________________(2nd reading) HR__________  Date: _______________________  a.m. _____________________(1st reading) HR___________            p.m. _____________________(2nd reading) HR__________  Date: _______________________  a.m. _____________________(1st reading) HR___________            p.m. _____________________(2nd reading) HR__________  Date: _______________________  a.m. _____________________(1st reading) HR___________            p.m. _____________________(2nd reading)  HR__________   This information is not intended to replace advice given to you by your health care provider. Make sure you discuss any questions you have with your health care provider. Document Revised: 09/25/2019 Document Reviewed: 09/25/2019 Elsevier Patient Education  2021 Elsevier Inc.   Medication Instructions:  Your physician has recommended you make the following change in your medication:   Stop Amiodarone  Start Toprol XL 25 mg daily  *If you need a refill on your cardiac medications before your next appointment, please call your pharmacy*   Lab Work: None ordered If you have labs (blood work) drawn today and your tests are completely normal, you will receive your results only by: MyChart Message (if you have MyChart) OR A paper copy in the mail If you have any lab test that is abnormal or we need to change your treatment, we will call you to review the results.   Testing/Procedures: Your physician has requested that you have an echocardiogram. Echocardiography is a painless test that uses sound waves to create images of your heart. It provides your doctor with information about the size and shape of your heart and how well your heart's chambers and valves are working. This procedure takes approximately one hour. There are no restrictions for this procedure. Please do NOT wear cologne, perfume, aftershave, or lotions (deodorant is allowed). Please arrive 15 minutes prior to your appointment time.  A zio monitor was ordered today. It will remain on for 14 days. Remove 03/07/23. You will then return monitor and event diary in provided box. It takes 1-2 weeks for report to be downloaded and returned to [place]. We will call you with the results. If  monitor falls off or has orange flashing light, please call Zio for further instructions.    Follow-Up: At [facility], you and your health needs are our priority.  As part of our continuing mission to provide you with exceptional  heart care, we have created designated Provider Care Teams.  These Care Teams include your primary Cardiologist (physician) and Advanced Practice Providers (APPs -  Physician Assistants and Nurse Practitioners) who all work together to provide you with the care you need, when you need it.  We recommend signing up for the patient portal called "MyChart".  Sign up information is provided on this After Visit Summary.  MyChart is used to connect with patients for Virtual Visits (Telemedicine).  Patients are able to view lab/test results, encounter notes, upcoming appointments, etc.  Non-urgent messages can be sent to your provider as well.   To learn more about what you can do with MyChart, go to [email or web address].    Your next appointment:   1 month(s)  The format for your next appointment:   In Person  Provider:   [name], MD, Belva Crome, MD, or Wallis Bamberg, NP Rosalita Levan)   Other Instructions Echocardiogram An echocardiogram is a test that uses sound waves (ultrasound) to produce images of the heart. Images from an echocardiogram can provide important information about: Heart size and shape. The size and thickness and movement of your heart's walls. Heart muscle function and strength. Heart valve function or if you have stenosis. Stenosis is when the heart valves are too narrow. If blood is flowing backward through the heart valves (regurgitation). A tumor or infectious growth around the heart valves. Areas of heart muscle that are not working well because of poor blood flow or injury from a heart attack. Aneurysm detection. An aneurysm is a weak or damaged part of an artery wall. The wall bulges out from the normal force of blood pumping through the body. Tell a health care provider about: Any allergies you have. All medicines you are taking, including vitamins, herbs, eye drops, creams, and over-the-counter medicines. Any blood disorders you have. Any surgeries  you have had. Any medical conditions you have. Whether you are pregnant or may be pregnant. What are the risks? Generally, this is a safe test. However, problems may occur, including an allergic reaction to dye (contrast) that may be used during the test. What happens before the test? No specific preparation is needed. You may eat and drink normally. What happens during the test? You will take off your clothes from the waist up and put on a hospital gown. Electrodes or electrocardiogram (ECG)patches may be placed on your chest. The electrodes or patches are then connected to a device that monitors your heart rate and rhythm. You will lie down on a table for an ultrasound exam. A gel will be applied to your chest to help sound waves pass through your skin. A handheld device, called a transducer, will be pressed against your chest and moved over your heart. The transducer produces sound waves that travel to your heart and bounce back (or "echo" back) to the transducer. These sound waves will be captured in real-time and changed into images of your heart that can be viewed on a video monitor. The images will be recorded on a computer and reviewed by your health care provider. You may be asked to change positions or hold your breath for a short time. This makes it easier to get different views or better views of your heart.  In some cases, you may receive contrast through an IV in one of your veins. This can improve the quality of the pictures from your heart. The procedure may vary among health care providers and hospitals.   What can I expect after the test? You may return to your normal, everyday life, including diet, activities, and medicines, unless your health care provider tells you not to do that. Follow these instructions at home: It is up to you to get the results of your test. Ask your health care provider, or the department that is doing the test, when your results will be ready. Keep all  follow-up visits. This is important. Summary An echocardiogram is a test that uses sound waves (ultrasound) to produce images of the heart. Images from an echocardiogram can provide important information about the size and shape of your heart, heart muscle function, heart valve function, and other possible heart problems. You do not need to do anything to prepare before this test. You may eat and drink normally. After the echocardiogram is completed, you may return to your normal, everyday life, unless your health care provider tells you not to do that. This information is not intended to replace advice given to you by your health care provider. Make sure you discuss any questions you have with your health care provider. Document Revised: 01/28/2020 Document Reviewed: 01/28/2020 Elsevier Patient Education  2021 Elsevier Inc.   Important Information About Sugar

## 2023-02-22 NOTE — Telephone Encounter (Signed)
error 

## 2023-02-27 ENCOUNTER — Institutional Professional Consult (permissible substitution): Payer: PPO | Admitting: Cardiology

## 2023-03-02 DIAGNOSIS — I480 Paroxysmal atrial fibrillation: Secondary | ICD-10-CM | POA: Diagnosis not present

## 2023-03-02 DIAGNOSIS — R7302 Impaired glucose tolerance (oral): Secondary | ICD-10-CM | POA: Diagnosis not present

## 2023-03-02 DIAGNOSIS — E782 Mixed hyperlipidemia: Secondary | ICD-10-CM | POA: Diagnosis not present

## 2023-03-02 DIAGNOSIS — I456 Pre-excitation syndrome: Secondary | ICD-10-CM | POA: Diagnosis not present

## 2023-03-02 DIAGNOSIS — Z79899 Other long term (current) drug therapy: Secondary | ICD-10-CM | POA: Diagnosis not present

## 2023-03-02 DIAGNOSIS — D6869 Other thrombophilia: Secondary | ICD-10-CM | POA: Diagnosis not present

## 2023-03-02 DIAGNOSIS — I700 Atherosclerosis of aorta: Secondary | ICD-10-CM | POA: Diagnosis not present

## 2023-03-02 DIAGNOSIS — Z1339 Encounter for screening examination for other mental health and behavioral disorders: Secondary | ICD-10-CM | POA: Diagnosis not present

## 2023-03-02 DIAGNOSIS — C61 Malignant neoplasm of prostate: Secondary | ICD-10-CM | POA: Diagnosis not present

## 2023-03-02 DIAGNOSIS — G4700 Insomnia, unspecified: Secondary | ICD-10-CM | POA: Diagnosis not present

## 2023-03-02 DIAGNOSIS — E039 Hypothyroidism, unspecified: Secondary | ICD-10-CM | POA: Diagnosis not present

## 2023-03-02 DIAGNOSIS — N1831 Chronic kidney disease, stage 3a: Secondary | ICD-10-CM | POA: Diagnosis not present

## 2023-03-09 ENCOUNTER — Ambulatory Visit: Payer: PPO | Admitting: Cardiology

## 2023-03-13 ENCOUNTER — Encounter: Payer: Self-pay | Admitting: Specialist

## 2023-03-15 ENCOUNTER — Telehealth: Payer: Self-pay

## 2023-03-15 DIAGNOSIS — I480 Paroxysmal atrial fibrillation: Secondary | ICD-10-CM | POA: Diagnosis not present

## 2023-03-15 NOTE — Telephone Encounter (Signed)
    Primary Cardiologist: [name], MD  Chart reviewed as part of pre-operative protocol coverage. Simple dental extractions are considered low risk procedures per guidelines and generally do not require any specific cardiac clearance. It is also generally accepted that for simple extractions and dental cleanings, there is no need to interrupt blood thinner therapy.   Patient with diagnosis of afib on Eliquis for anticoagulation.     Procedure:  Dental Extraction - Amount of Teeth to be Pulled:  5-6 and they will have to drill into bone per office  and place 4 dental implants Date of procedure: TBD     CHA2DS2-VASc Score = 2   This indicates a 2.2% annual risk of stroke. The patient's score is based upon: CHF History: 0 HTN History: 1 Diabetes History: 0 Stroke History: 0 Vascular Disease History: 0 Age Score: 1 Gender Score: 0       CrCl 43 ml/min   Patient does NOT require pre-op antibiotics for dental procedure.   Per office protocol, patient can hold Eliquis for 1 days prior to procedure.  I will route this recommendation to the requesting party via Epic fax function and remove from pre-op pool.  Please call with questions.  Ronney Asters, NP 03/15/2023, 11:38 AM

## 2023-03-15 NOTE — Telephone Encounter (Signed)
Pre-operative Risk Assessment    Patient Name: Ronald Stokes  DOB: Mar 19, 1951 MRN: [id number]     Request for Surgical Clearance    Procedure:  Dental Extraction - Amount of Teeth to be Pulled:  5-6 and they will have to drill into bone per office  and place 4 dental implants  Date of Surgery:  Clearance TBD                                 Surgeon:  Virgel Paling, DDS, MS Surgeon's Group or Practice Name:  [facility] Binnie Kand, DDS, MS, PA Phone number:  [phone] Fax number:  [phone]   Type of Clearance Requested:   - Pharmacy:  Hold Apixaban (Eliquis) please advise   Type of Anesthesia:  Local    Additional requests/questions:    Ronald Stokes   03/15/2023, 9:28 AM

## 2023-03-15 NOTE — Telephone Encounter (Signed)
Patient with diagnosis of afib on Eliquis for anticoagulation.    Procedure:  Dental Extraction - Amount of Teeth to be Pulled:  5-6 and they will have to drill into bone per office  and place 4 dental implants Date of procedure: TBD   CHA2DS2-VASc Score = 2   This indicates a 2.2% annual risk of stroke. The patient's score is based upon: CHF History: 0 HTN History: 1 Diabetes History: 0 Stroke History: 0 Vascular Disease History: 0 Age Score: 1 Gender Score: 0      CrCl 43 ml/min  Patient does NOT require pre-op antibiotics for dental procedure.  Per office protocol, patient can hold Eliquis for 1 days prior to procedure.    **This guidance is not considered finalized until pre-operative APP has relayed final recommendations.**

## 2023-03-20 ENCOUNTER — Ambulatory Visit: Payer: PPO | Attending: Cardiology | Admitting: Cardiology

## 2023-03-20 ENCOUNTER — Encounter: Payer: Self-pay | Admitting: Cardiology

## 2023-03-20 VITALS — BP 122/84 | HR 72 | Ht 70.0 in | Wt 186.6 lb

## 2023-03-20 DIAGNOSIS — I422 Other hypertrophic cardiomyopathy: Secondary | ICD-10-CM

## 2023-03-20 DIAGNOSIS — I10 Essential (primary) hypertension: Secondary | ICD-10-CM

## 2023-03-20 DIAGNOSIS — D6869 Other thrombophilia: Secondary | ICD-10-CM | POA: Diagnosis not present

## 2023-03-20 DIAGNOSIS — I4819 Other persistent atrial fibrillation: Secondary | ICD-10-CM | POA: Diagnosis not present

## 2023-03-20 NOTE — Patient Instructions (Signed)
Medication Instructions:  Your physician recommends that you continue on your current medications as directed. Please refer to the Current Medication list given to you today.  *If you need a refill on your cardiac medications before your next appointment, please call your pharmacy*   Lab Work: Pre procedure labs -- see procedure instruction letter:  BMP & CBC  If you have a lab test that is abnormal and we need to change your treatment, we will call you to review the results -- otherwise no news is good news.    Testing/Procedures: Your physician has requested that you have cardiac CT within 2 weeks PRIOR to your ablation. Cardiac computed tomography (CT) is a painless test that uses an x-ray machine to take clear, detailed pictures of your heart.  Please follow instruction below located under "other instructions". You will get a call from our office to schedule the date for this test.  Your physician has recommended that you have an ablation. Catheter ablation is a medical procedure used to treat some cardiac arrhythmias (irregular heartbeats). During catheter ablation, a long, thin, flexible tube is put into a blood vessel in your groin (upper thigh), or neck. This tube is called an ablation catheter. It is then guided to your heart through the blood vessel. Radio frequency waves destroy small areas of heart tissue where abnormal heartbeats may cause an arrhythmia to start.   You will be scheduled for __________.  The EP scheduler, April, will be in touch with CT & procedure instructions.   Follow-Up: At [facility], you and your health needs are our priority.  As part of our continuing mission to provide you with exceptional heart care, we have created designated Provider Care Teams.  These Care Teams include your primary Cardiologist (physician) and Advanced Practice Providers (APPs -  Physician Assistants and Nurse Practitioners) who all work together to provide you with the care you  need, when you need it.  We recommend signing up for the patient portal called "MyChart".  Sign up information is provided on this After Visit Summary.  MyChart is used to connect with patients for Virtual Visits (Telemedicine).  Patients are able to view lab/test results, encounter notes, upcoming appointments, etc.  Non-urgent messages can be sent to your provider as well.   To learn more about what you can do with MyChart, go to [email or web address].    Your next appointment:   1 month(s) after your ablation  The format for your next appointment:   In Person  Provider:   AFib clinic   Thank you for choosing [facility]!!   Dory Horn, RN [phone]    Other Instructions   Cardiac Ablation Cardiac ablation is a procedure to destroy (ablate) some heart tissue that is sending bad signals. These bad signals cause problems in heart rhythm. The heart has many areas that make these signals. If there are problems in these areas, they can make the heart beat in a way that is not normal. Destroying some tissues can help make the heart rhythm normal. Tell your doctor about: Any allergies you have. All medicines you are taking. These include vitamins, herbs, eye drops, creams, and over-the-counter medicines. Any problems you or family members have had with medicines that make you fall asleep (anesthetics). Any blood disorders you have. Any surgeries you have had. Any medical conditions you have, such as kidney failure. Whether you are pregnant or may be pregnant. What are the risks? This is a safe procedure. But problems  may occur, including: Infection. Bruising and bleeding. Bleeding into the chest. Stroke or blood clots. Damage to nearby areas of your body. Allergies to medicines or dyes. The need for a pacemaker if the normal system is damaged. Failure of the procedure to treat the problem. What happens before the procedure? Medicines Ask your doctor  about: Changing or stopping your normal medicines. This is important. Taking aspirin and ibuprofen. Do not take these medicines unless your doctor tells you to take them. Taking other medicines, vitamins, herbs, and supplements. General instructions Follow instructions from your doctor about what you cannot eat or drink. Plan to have someone take you home from the hospital or clinic. If you will be going home right after the procedure, plan to have someone with you for 24 hours. Ask your doctor what steps will be taken to prevent infection. What happens during the procedure?  An IV tube will be put into one of your veins. You will be given a medicine to help you relax. The skin on your neck or groin will be numbed. A cut (incision) will be made in your neck or groin. A needle will be put through your cut and into a large vein. A tube (catheter) will be put into the needle. The tube will be moved to your heart. Dye may be put through the tube. This helps your doctor see your heart. Small devices (electrodes) on the tube will send out signals. A type of energy will be used to destroy some heart tissue. The tube will be taken out. Pressure will be held on your cut. This helps stop bleeding. A bandage will be put over your cut. The exact procedure may vary among doctors and hospitals. What happens after the procedure? You will be watched until you leave the hospital or clinic. This includes checking your heart rate, breathing rate, oxygen, and blood pressure. Your cut will be watched for bleeding. You will need to lie still for a few hours. Do not drive for 24 hours or as long as your doctor tells you. Summary Cardiac ablation is a procedure to destroy some heart tissue. This is done to treat heart rhythm problems. Tell your doctor about any medical conditions you may have. Tell him or her about all medicines you are taking to treat them. This is a safe procedure. But problems may occur.  These include infection, bruising, bleeding, and damage to nearby areas of your body. Follow what your doctor tells you about food and drink. You may also be told to change or stop some of your medicines. After the procedure, do not drive for 24 hours or as long as your doctor tells you. This information is not intended to replace advice given to you by your health care provider. Make sure you discuss any questions you have with your health care provider. Document Revised: 08/27/2021 Document Reviewed: 05/09/2019 Elsevier Patient Education  2023 Elsevier Inc.   Cardiac Ablation, Care After  This sheet gives you information about how to care for yourself after your procedure. Your health care provider may also give you more specific instructions. If you have problems or questions, contact your health care provider. What can I expect after the procedure? After the procedure, it is common to have: Bruising around your puncture site. Tenderness around your puncture site. Skipped heartbeats. If you had an atrial fibrillation ablation, you may have atrial fibrillation during the first several months after your procedure.  Tiredness (fatigue).  Follow these instructions at home: Puncture  site care  Follow instructions from your health care provider about how to take care of your puncture site. Make sure you: If present, leave stitches (sutures), skin glue, or adhesive strips in place. These skin closures may need to stay in place for up to 2 weeks. If adhesive strip edges start to loosen and curl up, you may trim the loose edges. Do not remove adhesive strips completely unless your health care provider tells you to do that. If a large square bandage is present, this may be removed 24 hours after surgery.  Check your puncture site every day for signs of infection. Check for: Redness, swelling, or pain. Fluid or blood. If your puncture site starts to bleed, lie down on your back, apply firm pressure  to the area, and contact your health care provider. Warmth. Pus or a bad smell. A pea or small marble sized lump at the site is normal and can take up to three months to resolve.  Driving Do not drive for at least 4 days after your procedure or however long your health care provider recommends. (Do not resume driving if you have previously been instructed not to drive for other health reasons.) Do not drive or use heavy machinery while taking prescription pain medicine. Activity Avoid activities that take a lot of effort for at least 7 days after your procedure. Do not lift anything that is heavier than 5 lb (4.5 kg) for one week.  No sexual activity for 1 week.  Return to your normal activities as told by your health care provider. Ask your health care provider what activities are safe for you. General instructions Take over-the-counter and prescription medicines only as told by your health care provider. Do not use any products that contain nicotine or tobacco, such as cigarettes and e-cigarettes. If you need help quitting, ask your health care provider. You may shower after 24 hours, but Do not take baths, swim, or use a hot tub for 1 week.  Do not drink alcohol for 24 hours after your procedure. Keep all follow-up visits as told by your health care provider. This is important. Contact a health care provider if: You have redness, mild swelling, or pain around your puncture site. You have fluid or blood coming from your puncture site that stops after applying firm pressure to the area. Your puncture site feels warm to the touch. You have pus or a bad smell coming from your puncture site. You have a fever. You have chest pain or discomfort that spreads to your neck, jaw, or arm. You have chest pain that is worse with lying on your back or taking a deep breath. You are sweating a lot. You feel nauseous. You have a fast or irregular heartbeat. You have shortness of breath. You are dizzy  or light-headed and feel the need to lie down. You have pain or numbness in the arm or leg closest to your puncture site. Get help right away if: Your puncture site suddenly swells. Your puncture site is bleeding and the bleeding does not stop after applying firm pressure to the area. These symptoms may represent a serious problem that is an emergency. Do not wait to see if the symptoms will go away. Get medical help right away. Call your local emergency services (911 in the [place] Do not drive yourself to the hospital. Summary After the procedure, it is normal to have bruising and tenderness at the puncture site in your groin, neck, or forearm. Check your puncture  site every day for signs of infection. Get help right away if your puncture site is bleeding and the bleeding does not stop after applying firm pressure to the area. This is a medical emergency. This information is not intended to replace advice given to you by your health care provider. Make sure you discuss any questions you have with your health care provider.

## 2023-03-20 NOTE — Progress Notes (Signed)
Electrophysiology Office Note:   Date:  03/20/2023  ID:  Nazaiah Blaschko, DOB [birth year], MRN [id number]  Primary Cardiologist: [name], MD Electrophysiologist: Regan Lemming, MD      History of Present Illness:   Ronald Stokes is a 72 y.o. male with h/o apical hypertrophic cardiomyopathy, atrial fibrillation, hyperlipidemia, prostate cancer seen today for routine electrophysiology followup.   He was previously seen in cardiology clinic.  He was noted to have a significantly elevated TSH and thus amiodarone was stopped.  Since last being seen in our clinic the patient reports continued fatigue and shortness of breath.  He attributes this to his atrial fibrillation.  He continues to be able to do his daily activities, but is needing to do them more slowly.  he denies chest pain, palpitations, dyspnea, PND, orthopnea, nausea, vomiting, dizziness, syncope, edema, weight gain, or early satiety.   Review of systems complete and found to be negative unless listed in HPI.   EP Information / Studies Reviewed:    EKG is not ordered today. EKG from 02/21/23 reviewed which showed atrial fibrillation        Risk Assessment/Calculations:    CHA2DS2-VASc Score = 2   This indicates a 2.2% annual risk of stroke. The patient's score is based upon: CHF History: 0 HTN History: 1 Diabetes History: 0 Stroke History: 0 Vascular Disease History: 0 Age Score: 1 Gender Score: 0             Physical Exam:   VS:  BP 122/84   Pulse 72   Ht 5\' 10"  (1.778 m)   Wt 186 lb 9.6 oz (84.6 kg)   SpO2 94%   BMI 26.77 kg/m    Wt Readings from Last 3 Encounters:  03/20/23 186 lb 9.6 oz (84.6 kg)  02/21/23 187 lb 6.4 oz (85 kg)  02/16/23 188 lb (85.3 kg)     GEN: Well nourished, well developed in no acute distress NECK: No JVD; No carotid bruits CARDIAC: Irregularly irregular rate and rhythm, no murmurs, rubs, gallops RESPIRATORY:  Clear to auscultation without rales, wheezing or rhonchi   ABDOMEN: Soft, non-tender, non-distended EXTREMITIES:  No edema; No deformity   ASSESSMENT AND PLAN:    1.  Paroxysmal atrial fibrillation: Amiodarone has been stopped due to thyroid issues.  He feels poorly in atrial fibrillation with fatigue and shortness of breath.  He would like a rhythm control strategy.  Options are somewhat limited due to his hypertrophic cardiomyopathy.  Due to that, we Anamaria Dusenbury plan for ablation.  Risk and benefits have been discussed.  He understands the risks and is agreed to the procedure.  Risk, benefits, and alternatives to EP study and radiofrequency/pulse field ablation for afib were also discussed in detail today. These risks include but are not limited to stroke, bleeding, vascular damage, tamponade, perforation, damage to the esophagus, lungs, and other structures, pulmonary vein stenosis, worsening renal function, and death. The patient understands these risk and wishes to proceed.  We Kyrstin Campillo therefore proceed with catheter ablation at the next available time.  Carto, ICE, anesthesia are requested for the procedure.  Piotr Christopher also obtain CT PV protocol prior to the procedure to exclude LAA thrombus and further evaluate atrial anatomy.  2.  Apical variant hypertrophic cardiomyopathy: No ventricular arrhythmias noted.  No indication for ICD therapy.  Management per primary cardiology.  3.  Hypertension: Currently well-controlled  4.  Secondary to coagula state: Currently on Eliquis for atrial fibrillation  5.  Preoperative evaluation: Patient needs  colonoscopy.  He has fatigue and shortness of breath likely due to his atrial fibrillation.  He would be at intermediate risk for this intermediate risk procedure.  No further cardiac testing is necessary.  Anthonie Lotito be able to hold anticoagulation for 3 days.  Follow up with Dr. Elberta Fortis as usual post procedure  Signed, Kiandria Clum Jorja Loa, MD

## 2023-03-22 ENCOUNTER — Ambulatory Visit: Payer: PPO

## 2023-03-27 ENCOUNTER — Ambulatory Visit: Payer: PPO | Admitting: Cardiology

## 2023-03-29 ENCOUNTER — Telehealth: Payer: Self-pay | Admitting: Cardiology

## 2023-03-29 NOTE — Telephone Encounter (Signed)
Dentist office of patient called stating they are faxing an urgent clearance request for dental extractions.  Was unable to get contact information before call was disconnected.   Fax is being sent to Ronald Stokes's main fax number.

## 2023-03-30 ENCOUNTER — Telehealth: Payer: Self-pay

## 2023-03-30 NOTE — Telephone Encounter (Signed)
Patient with diagnosis of Afib on Eliquis for anticoagulation.    Procedure:  Dental Extraction - Amount of Teeth to be Pulled:  4  Date of procedure: Urgent    CHA2DS2-VASc Score = 2   This indicates a 2.2% annual risk of stroke. The patient's score is based upon: CHF History: 0 HTN History: 1 Diabetes History: 0 Stroke History: 0 Vascular Disease History: 0 Age Score: 1 Gender Score: 0     CrCl 40 mL/min Platelet count 235 K    Per office protocol,patient can hold Eliquis for 1 days prior to procedure.     **This guidance is not considered finalized until pre-operative APP has relayed final recommendations.**

## 2023-03-30 NOTE — Telephone Encounter (Signed)
Clearance completed

## 2023-03-30 NOTE — Telephone Encounter (Signed)
Name: Ronald Stokes  DOB: 1951/05/29  MRN: [id number]   Primary Cardiologist: [name], MD  Chart reviewed as part of pre-operative protocol coverage. Patient was contacted 03/30/2023 in reference to pre-operative risk assessment for pending surgery as outlined below. No answer. Left voicemail for pt to return call at earliest convenience.   Per office protocol,patient can hold Eliquis for 1 days prior to procedure. Please resume Eliquis as soon as possible postprocedure, at the discretion of the surgeon.   SBE prophylaxis is not required for this patient.     Joylene Grapes, NP 03/30/2023, 3:59 PM

## 2023-03-30 NOTE — Telephone Encounter (Signed)
Pre-operative Risk Assessment    Patient Name: Ronald Stokes  DOB: Oct 11, 1950 MRN: [id number]     Request for Surgical Clearance    Procedure:  Dental Extraction - Amount of Teeth to be Pulled:  4  Date of Surgery:  Clearance URGENT                            Surgeon:  Dr. Hilma Favors Surgeon's Group or Practice Name:  Dr. Hilma Favors Phone number:  [phone] Fax number:  [phone]   Type of Clearance Requested:   - Medical  - Pharmacy:  Hold Apixaban (Eliquis) 2 days   Type of Anesthesia:  Local    Additional requests/questions:    Ivor Costa   03/30/2023, 9:34 AM

## 2023-04-03 ENCOUNTER — Telehealth: Payer: Self-pay

## 2023-04-03 NOTE — Telephone Encounter (Signed)
LM to call back to schedule Afib Ablation with Dr. Elberta Fortis

## 2023-04-04 NOTE — Telephone Encounter (Signed)
Pt's Cousin (Iris) had called my phone earlier and LM - I was returning her call but had to LM again for her to call me back.

## 2023-04-04 NOTE — Telephone Encounter (Signed)
Clearance status refax per Dr. Hilma Favors request

## 2023-04-05 ENCOUNTER — Other Ambulatory Visit: Payer: Self-pay

## 2023-04-05 ENCOUNTER — Telehealth: Payer: Self-pay

## 2023-04-05 DIAGNOSIS — I4819 Other persistent atrial fibrillation: Secondary | ICD-10-CM

## 2023-04-05 NOTE — Telephone Encounter (Signed)
Spoke with Iris (Pt's Cousin) and scheduled him for Afib Ablation with Dr. Elberta Fortis on 11/7 at 10:30 AM. CT is scheduled on 10/2/ at 3:00. Pt will have labs done at [facility] office.  Instruction letter will be mailed to [facility] but is she has not received it when he goes for labs I advised them to ask the [place] office to print it out for them.

## 2023-04-10 ENCOUNTER — Other Ambulatory Visit: Payer: Self-pay

## 2023-04-10 DIAGNOSIS — R109 Unspecified abdominal pain: Secondary | ICD-10-CM | POA: Diagnosis not present

## 2023-04-10 DIAGNOSIS — I480 Paroxysmal atrial fibrillation: Secondary | ICD-10-CM | POA: Diagnosis not present

## 2023-04-10 DIAGNOSIS — G8929 Other chronic pain: Secondary | ICD-10-CM | POA: Diagnosis not present

## 2023-04-10 DIAGNOSIS — E782 Mixed hyperlipidemia: Secondary | ICD-10-CM | POA: Diagnosis not present

## 2023-04-10 DIAGNOSIS — E032 Hypothyroidism due to medicaments and other exogenous substances: Secondary | ICD-10-CM | POA: Diagnosis not present

## 2023-04-10 DIAGNOSIS — Z6826 Body mass index (BMI) 26.0-26.9, adult: Secondary | ICD-10-CM | POA: Diagnosis not present

## 2023-04-10 MED ORDER — ATORVASTATIN CALCIUM 40 MG PO TABS: 40.0000 mg | ORAL_TABLET | Freq: Every day | ORAL | 3 refills | Status: DC

## 2023-04-11 DIAGNOSIS — L821 Other seborrheic keratosis: Secondary | ICD-10-CM | POA: Diagnosis not present

## 2023-04-11 DIAGNOSIS — L570 Actinic keratosis: Secondary | ICD-10-CM | POA: Diagnosis not present

## 2023-04-11 DIAGNOSIS — I4819 Other persistent atrial fibrillation: Secondary | ICD-10-CM | POA: Diagnosis not present

## 2023-04-11 DIAGNOSIS — L578 Other skin changes due to chronic exposure to nonionizing radiation: Secondary | ICD-10-CM | POA: Diagnosis not present

## 2023-04-12 LAB — CBC
Hematocrit: 46 % (ref 37.5–51.0)
Hemoglobin: 15.1 g/dL (ref 13.0–17.7)
MCH: 33.3 pg — ABNORMAL HIGH (ref 26.6–33.0)
MCHC: 32.8 g/dL (ref 31.5–35.7)
MCV: 101 fL — ABNORMAL HIGH (ref 79–97)
Platelets: 168 10*3/uL (ref 150–450)
RBC: 4.54 x10E6/uL (ref 4.14–5.80)
RDW: 12.5 % (ref 11.6–15.4)
WBC: 5.7 10*3/uL (ref 3.4–10.8)

## 2023-04-12 LAB — BASIC METABOLIC PANEL
BUN/Creatinine Ratio: 13 (ref 10–24)
BUN: 20 mg/dL (ref 8–27)
CO2: 23 mmol/L (ref 20–29)
Calcium: 9.6 mg/dL (ref 8.6–10.2)
Chloride: 105 mmol/L (ref 96–106)
Creatinine, Ser: 1.53 mg/dL — ABNORMAL HIGH (ref 0.76–1.27)
Glucose: 103 mg/dL — ABNORMAL HIGH (ref 70–99)
Potassium: 4.4 mmol/L (ref 3.5–5.2)
Sodium: 144 mmol/L (ref 134–144)
eGFR: 48 mL/min/{1.73_m2} — ABNORMAL LOW (ref >59)

## 2023-04-13 ENCOUNTER — Telehealth (HOSPITAL_COMMUNITY): Payer: Self-pay | Admitting: *Deleted

## 2023-04-13 NOTE — Telephone Encounter (Signed)
Reaching out to patient to offer assistance regarding upcoming cardiac imaging study; pt verbalizes understanding of appt date/time, parking situation and where to check in, pre-test NPO status, and verified current allergies; name and call back number provided for further questions should they arise  Larey Brick RN Navigator Cardiac Imaging [facility] [phone] office [phone] cell  Patient aware to arrive at 2:30 PM. Per his request, instructions emailed to him.

## 2023-04-17 ENCOUNTER — Ambulatory Visit (HOSPITAL_COMMUNITY)
Admission: RE | Admit: 2023-04-17 | Discharge: 2023-04-17 | Disposition: A | Discharge status: Home / Self Care | Payer: PPO | Source: Ambulatory Visit | Attending: Family Medicine | Admitting: Family Medicine

## 2023-04-17 ENCOUNTER — Ambulatory Visit (INDEPENDENT_AMBULATORY_CARE_PROVIDER_SITE_OTHER): Payer: PPO

## 2023-04-17 DIAGNOSIS — I480 Paroxysmal atrial fibrillation: Secondary | ICD-10-CM | POA: Insufficient documentation

## 2023-04-17 DIAGNOSIS — I4819 Other persistent atrial fibrillation: Secondary | ICD-10-CM

## 2023-04-17 LAB — ECHOCARDIOGRAM COMPLETE
Calc EF: 44.4 %
MV M vel: 4.78 m/s
MV Peak grad: 91.4 mm[Hg]
Radius: 0.7 cm
S' Lateral: 4.1 cm
Single Plane A2C EF: 40.6 %
Single Plane A4C EF: 45.3 %

## 2023-04-17 MED ORDER — IOHEXOL 350 MG/ML SOLN
95.0000 mL | Freq: Once | INTRAVENOUS | Status: AC | PRN
  Administered 2023-04-17: 95 mL via INTRAVENOUS

## 2023-04-20 DIAGNOSIS — N1831 Chronic kidney disease, stage 3a: Secondary | ICD-10-CM | POA: Diagnosis not present

## 2023-04-20 DIAGNOSIS — I422 Other hypertrophic cardiomyopathy: Secondary | ICD-10-CM | POA: Diagnosis not present

## 2023-04-20 DIAGNOSIS — I480 Paroxysmal atrial fibrillation: Secondary | ICD-10-CM | POA: Diagnosis not present

## 2023-04-21 ENCOUNTER — Encounter: Payer: Self-pay | Admitting: Cardiology

## 2023-04-21 ENCOUNTER — Ambulatory Visit: Payer: PPO | Attending: Cardiology | Admitting: Cardiology

## 2023-04-21 VITALS — BP 116/80 | HR 77 | Ht 70.0 in | Wt 184.4 lb

## 2023-04-21 DIAGNOSIS — D6869 Other thrombophilia: Secondary | ICD-10-CM | POA: Diagnosis not present

## 2023-04-21 DIAGNOSIS — R931 Abnormal findings on diagnostic imaging of heart and coronary circulation: Secondary | ICD-10-CM | POA: Diagnosis not present

## 2023-04-21 DIAGNOSIS — E782 Mixed hyperlipidemia: Secondary | ICD-10-CM

## 2023-04-21 DIAGNOSIS — E039 Hypothyroidism, unspecified: Secondary | ICD-10-CM

## 2023-04-21 DIAGNOSIS — I10 Essential (primary) hypertension: Secondary | ICD-10-CM | POA: Diagnosis not present

## 2023-04-21 DIAGNOSIS — I4819 Other persistent atrial fibrillation: Secondary | ICD-10-CM

## 2023-04-21 DIAGNOSIS — I456 Pre-excitation syndrome: Secondary | ICD-10-CM

## 2023-04-21 NOTE — Patient Instructions (Signed)
Medication Instructions:  Your physician recommends that you continue on your current medications as directed. Please refer to the Current Medication list given to you today.  *If you need a refill on your cardiac medications before your next appointment, please call your pharmacy*   Lab Work: NONE If you have labs (blood work) drawn today and your tests are completely normal, you will receive your results only by: MyChart Message (if you have MyChart) OR A paper copy in the mail If you have any lab test that is abnormal or we need to change your treatment, we will call you to review the results.   Testing/Procedures: NONE   Follow-Up: At [facility], you and your health needs are our priority.  As part of our continuing mission to provide you with exceptional heart care, we have created designated Provider Care Teams.  These Care Teams include your primary Cardiologist (physician) and Advanced Practice Providers (APPs -  Physician Assistants and Nurse Practitioners) who all work together to provide you with the care you need, when you need it.  We recommend signing up for the patient portal called "MyChart".  Sign up information is provided on this After Visit Summary.  MyChart is used to connect with patients for Virtual Visits (Telemedicine).  Patients are able to view lab/test results, encounter notes, upcoming appointments, etc.  Non-urgent messages can be sent to your provider as well.   To learn more about what you can do with MyChart, go to [email or web address].    Your next appointment:   3 month(s)  Provider:   Rajan Revankar, MD    Other Instructions   

## 2023-04-26 NOTE — Pre-Procedure Instructions (Signed)
Instructed patient on the following items: Arrival time 0800 Nothing to eat or drink after midnight No meds AM of procedure Responsible person to drive you home and stay with you for 24 hrs  Have you missed any doses of anti-coagulant Eliquis- takes twice a day, did miss a dose on Wednesday 10/30, CT scan was 10/28.    Notified Dr Elberta Fortis- EKG on arrival is SR ok to proceed, if afib will do TEE on EP table.

## 2023-04-27 ENCOUNTER — Encounter (HOSPITAL_COMMUNITY)
Admission: RE | Disposition: A | Discharge status: Home / Self Care | Payer: Self-pay | Source: Home / Self Care | Attending: Cardiology

## 2023-04-27 ENCOUNTER — Other Ambulatory Visit: Payer: Self-pay

## 2023-04-27 ENCOUNTER — Ambulatory Visit (HOSPITAL_BASED_OUTPATIENT_CLINIC_OR_DEPARTMENT_OTHER): Payer: PPO

## 2023-04-27 ENCOUNTER — Ambulatory Visit (HOSPITAL_COMMUNITY): Payer: PPO

## 2023-04-27 ENCOUNTER — Ambulatory Visit (HOSPITAL_BASED_OUTPATIENT_CLINIC_OR_DEPARTMENT_OTHER)
Admission: RE | Admit: 2023-04-27 | Discharge: 2023-04-27 | Disposition: A | Discharge status: Home / Self Care | Payer: PPO | Source: Ambulatory Visit | Attending: Cardiology | Admitting: Cardiology

## 2023-04-27 ENCOUNTER — Ambulatory Visit (HOSPITAL_COMMUNITY)
Admission: RE | Admit: 2023-04-27 | Discharge: 2023-04-27 | Disposition: A | Discharge status: Home / Self Care | Payer: PPO | Attending: Cardiology | Admitting: Cardiology

## 2023-04-27 DIAGNOSIS — I129 Hypertensive chronic kidney disease with stage 1 through stage 4 chronic kidney disease, or unspecified chronic kidney disease: Secondary | ICD-10-CM | POA: Insufficient documentation

## 2023-04-27 DIAGNOSIS — N189 Chronic kidney disease, unspecified: Secondary | ICD-10-CM | POA: Diagnosis not present

## 2023-04-27 DIAGNOSIS — I340 Nonrheumatic mitral (valve) insufficiency: Secondary | ICD-10-CM | POA: Diagnosis not present

## 2023-04-27 DIAGNOSIS — I422 Other hypertrophic cardiomyopathy: Secondary | ICD-10-CM | POA: Diagnosis not present

## 2023-04-27 DIAGNOSIS — I4819 Other persistent atrial fibrillation: Secondary | ICD-10-CM | POA: Diagnosis not present

## 2023-04-27 DIAGNOSIS — I4891 Unspecified atrial fibrillation: Secondary | ICD-10-CM | POA: Diagnosis not present

## 2023-04-27 DIAGNOSIS — E785 Hyperlipidemia, unspecified: Secondary | ICD-10-CM | POA: Diagnosis not present

## 2023-04-27 DIAGNOSIS — K219 Gastro-esophageal reflux disease without esophagitis: Secondary | ICD-10-CM | POA: Insufficient documentation

## 2023-04-27 DIAGNOSIS — I480 Paroxysmal atrial fibrillation: Secondary | ICD-10-CM

## 2023-04-27 DIAGNOSIS — E039 Hypothyroidism, unspecified: Secondary | ICD-10-CM | POA: Insufficient documentation

## 2023-04-27 DIAGNOSIS — Z8546 Personal history of malignant neoplasm of prostate: Secondary | ICD-10-CM | POA: Diagnosis not present

## 2023-04-27 DIAGNOSIS — Z87891 Personal history of nicotine dependence: Secondary | ICD-10-CM | POA: Diagnosis not present

## 2023-04-27 HISTORY — PX: ATRIAL FIBRILLATION ABLATION: EP1191

## 2023-04-27 HISTORY — PX: TRANSESOPHAGEAL ECHOCARDIOGRAM (CATH LAB): EP1270

## 2023-04-27 LAB — POCT ACTIVATED CLOTTING TIME: Activated Clotting Time: 348 s

## 2023-04-27 LAB — ECHO TEE
Est EF: 40
MV M vel: 4.79 m/s
MV Peak grad: 91.8 mm[Hg]
Radius: 0.6 cm

## 2023-04-27 SURGERY — ATRIAL FIBRILLATION ABLATION
Anesthesia: General

## 2023-04-27 MED ORDER — SODIUM CHLORIDE 0.9% FLUSH
3.0000 mL | INTRAVENOUS | Status: DC | PRN
Start: 2023-04-27 — End: 2023-04-27

## 2023-04-27 MED ORDER — ACETAMINOPHEN 325 MG PO TABS
650.0000 mg | ORAL_TABLET | ORAL | Status: DC | PRN
  Administered 2023-04-27: 650 mg via ORAL
  Filled 2023-04-27: qty 2

## 2023-04-27 MED ORDER — PROPOFOL 10 MG/ML IV BOLUS
INTRAVENOUS | Status: DC | PRN
  Administered 2023-04-27: 150 mg via INTRAVENOUS

## 2023-04-27 MED ORDER — SODIUM CHLORIDE 0.9 % IV SOLN: INTRAVENOUS | Status: DC

## 2023-04-27 MED ORDER — ONDANSETRON HCL 4 MG/2ML IJ SOLN
INTRAMUSCULAR | Status: DC | PRN
  Administered 2023-04-27: 4 mg via INTRAVENOUS

## 2023-04-27 MED ORDER — DEXAMETHASONE SODIUM PHOSPHATE 10 MG/ML IJ SOLN
INTRAMUSCULAR | Status: DC | PRN
  Administered 2023-04-27: 10 mg via INTRAVENOUS

## 2023-04-27 MED ORDER — FENTANYL CITRATE (PF) 100 MCG/2ML IJ SOLN
INTRAMUSCULAR | Status: AC
  Filled 2023-04-27: qty 2

## 2023-04-27 MED ORDER — LIDOCAINE 2% (20 MG/ML) 5 ML SYRINGE
INTRAMUSCULAR | Status: DC | PRN
  Administered 2023-04-27: 80 mg via INTRAVENOUS

## 2023-04-27 MED ORDER — SUGAMMADEX SODIUM 200 MG/2ML IV SOLN
INTRAVENOUS | Status: DC | PRN
  Administered 2023-04-27: 400 mg via INTRAVENOUS

## 2023-04-27 MED ORDER — ATROPINE SULFATE 1 MG/ML IV SOLN
INTRAVENOUS | Status: DC | PRN
Start: 2023-04-27 — End: 2023-04-27
  Administered 2023-04-27: 1 mg via INTRAVENOUS

## 2023-04-27 MED ORDER — ATROPINE SULFATE 1 MG/10ML IJ SOSY
PREFILLED_SYRINGE | INTRAMUSCULAR | Status: AC
Start: 2023-04-27 — End: ?
  Filled 2023-04-27: qty 10

## 2023-04-27 MED ORDER — PHENYLEPHRINE 80 MCG/ML (10ML) SYRINGE FOR IV PUSH (FOR BLOOD PRESSURE SUPPORT)
PREFILLED_SYRINGE | INTRAVENOUS | Status: DC | PRN
  Administered 2023-04-27 (×2): 160 ug via INTRAVENOUS

## 2023-04-27 MED ORDER — HEPARIN SODIUM (PORCINE) 1000 UNIT/ML IJ SOLN
INTRAMUSCULAR | Status: DC | PRN
Start: 2023-04-27 — End: 2023-04-27
  Administered 2023-04-27: 14000 [IU] via INTRAVENOUS

## 2023-04-27 MED ORDER — PHENYLEPHRINE HCL-NACL 20-0.9 MG/250ML-% IV SOLN
INTRAVENOUS | Status: DC | PRN
  Administered 2023-04-27: 40 ug/min via INTRAVENOUS

## 2023-04-27 MED ORDER — ROCURONIUM BROMIDE 10 MG/ML (PF) SYRINGE
PREFILLED_SYRINGE | INTRAVENOUS | Status: DC | PRN
  Administered 2023-04-27: 60 mg via INTRAVENOUS
  Administered 2023-04-27: 40 mg via INTRAVENOUS

## 2023-04-27 MED ORDER — SODIUM CHLORIDE 0.9 % IV SOLN
250.0000 mL | INTRAVENOUS | Status: DC | PRN
Start: 2023-04-27 — End: 2023-04-27

## 2023-04-27 MED ORDER — ONDANSETRON HCL 4 MG/2ML IJ SOLN: 4.0000 mg | Freq: Four times a day (QID) | INTRAMUSCULAR | Status: DC | PRN

## 2023-04-27 MED ORDER — HEPARIN (PORCINE) IN NACL 1000-0.9 UT/500ML-% IV SOLN
INTRAVENOUS | Status: DC | PRN
  Administered 2023-04-27 (×3): 500 mL

## 2023-04-27 MED ORDER — FENTANYL CITRATE (PF) 250 MCG/5ML IJ SOLN
INTRAMUSCULAR | Status: DC | PRN
  Administered 2023-04-27 (×2): 50 ug via INTRAVENOUS

## 2023-04-27 MED ORDER — PROTAMINE SULFATE 10 MG/ML IV SOLN
INTRAVENOUS | Status: DC | PRN
Start: 2023-04-27 — End: 2023-04-27
  Administered 2023-04-27: 40 mg via INTRAVENOUS

## 2023-04-27 SURGICAL SUPPLY — 22 items
BAG SNAP BAND KOVER 36X36 (MISCELLANEOUS) IMPLANT
BLANKET WARM UNDERBOD FULL ACC (MISCELLANEOUS) ×1 IMPLANT
CABLE PFA RX CATH CONN (CABLE) IMPLANT
CATH 8FR REPROCESSED SOUNDSTAR (CATHETERS) ×1 IMPLANT
CATH 8FR SOUNDSTAR REPROCESSED (CATHETERS) IMPLANT
CATH FARAWAVE ABLATION 31 (CATHETERS) IMPLANT
CATH OCTARAY 2.0 F 3-3-3-3-3 (CATHETERS) IMPLANT
CATH WEB BI DIR CSDF CRV REPRO (CATHETERS) IMPLANT
CLOSURE PERCLOSE PROSTYLE (VASCULAR PRODUCTS) IMPLANT
COVER SWIFTLINK CONNECTOR (BAG) ×1 IMPLANT
DILATOR VESSEL 38 20CM 16FR (INTRODUCER) IMPLANT
GUIDEWIRE INQWIRE 1.5J.035X260 (WIRE) IMPLANT
INQWIRE 1.5J .035X260CM (WIRE) ×1 IMPLANT
PACK EP LATEX FREE (CUSTOM PROCEDURE TRAY) ×1
PACK EP LF (CUSTOM PROCEDURE TRAY) ×1 IMPLANT
PAD DEFIB RADIO PHYSIO CONN (PAD) ×1 IMPLANT
PATCH CARTO3 (PAD) IMPLANT
SHEATH FARADRIVE STEERABLE (SHEATH) IMPLANT
SHEATH PINNACLE 8F 10CM (SHEATH) IMPLANT
SHEATH PINNACLE 9F 10CM (SHEATH) IMPLANT
SHEATH PROBE COVER 6X72 (BAG) IMPLANT
SHEATH WIRE KIT BAYLIS SL1 (KITS) IMPLANT

## 2023-04-27 NOTE — Anesthesia Preprocedure Evaluation (Addendum)
Anesthesia Evaluation  Patient identified by MRN, date of birth, ID band Patient awake    Reviewed: Allergy & Precautions, H&P , NPO status , Patient's Chart, lab work & pertinent test results  Airway Mallampati: II  TM Distance: >3 FB Neck ROM: Full    Dental no notable dental hx.    Pulmonary former smoker   Pulmonary exam normal breath sounds clear to auscultation       Cardiovascular hypertension, Normal cardiovascular exam+ dysrhythmias Atrial Fibrillation  Rhythm:Regular Rate:Normal  Apical HCM   Neuro/Psych negative neurological ROS  negative psych ROS   GI/Hepatic Neg liver ROS,GERD  ,,  Endo/Other  Hypothyroidism    Renal/GU CRFRenal disease   Prostate cancer     Musculoskeletal negative musculoskeletal ROS (+)    Abdominal   Peds negative pediatric ROS (+)  Hematology negative hematology ROS (+)   Anesthesia Other Findings   Reproductive/Obstetrics negative OB ROS                             Anesthesia Physical Anesthesia Plan  ASA: 3  Anesthesia Plan: General   Post-op Pain Management:    Induction: Intravenous  PONV Risk Score and Plan: Ondansetron and Dexamethasone  Airway Management Planned: Oral ETT  Additional Equipment:   Intra-op Plan:   Post-operative Plan: Extubation in OR  Informed Consent: I have reviewed the patients History and Physical, chart, labs and discussed the procedure including the risks, benefits and alternatives for the proposed anesthesia with the patient or authorized representative who has indicated his/her understanding and acceptance.     Dental advisory given  Plan Discussed with: CRNA  Anesthesia Plan Comments:         Anesthesia Quick Evaluation

## 2023-04-27 NOTE — Anesthesia Postprocedure Evaluation (Signed)
Anesthesia Post Note  Patient: Ronald Stokes  Procedure(s) Performed: ATRIAL FIBRILLATION ABLATION TRANSESOPHAGEAL ECHOCARDIOGRAM     Patient location during evaluation: PACU Anesthesia Type: General Level of consciousness: awake and alert Pain management: pain level controlled Vital Signs Assessment: post-procedure vital signs reviewed and stable Respiratory status: spontaneous breathing, nonlabored ventilation, respiratory function stable and patient connected to nasal cannula oxygen Cardiovascular status: blood pressure returned to baseline and stable Postop Assessment: no apparent nausea or vomiting Anesthetic complications: no   There were no known notable events for this encounter.            [place]

## 2023-04-27 NOTE — Progress Notes (Signed)
Patient and sister was given discharge instructions. Both verbalized understanding. 

## 2023-04-27 NOTE — CV Procedure (Signed)
Procedure:  TEE  Indication: Atrial fibrillation, pre-ablation  Sedation: Per anesthesiology  Findings: Please see echo section for full report.  Normal LV size with mild LV hypertrophy.  EF 40% with diffuse hypokinesis. Normal RV size and systolic function.  Moderate left atrial enlargement, smoke in LA appendage but no thrombus.  Mild right atrial enlargement.  No PFO/ASD by color doppler.  Mild TR.  Mild-moderate MR, ERO 0.19 cm^2 by PISA.  Trileaflet aortic valve, no stenosis or regurgitation.   May proceed with ablation.   Ronald Stokes 04/27/2023 10:57 AM

## 2023-04-27 NOTE — Transfer of Care (Signed)
Immediate Anesthesia Transfer of Care Note  Patient: Ronald Stokes  Procedure(s) Performed: ATRIAL FIBRILLATION ABLATION TRANSESOPHAGEAL ECHOCARDIOGRAM  Patient Location: PACU and Cath Lab  Anesthesia Type:General  Level of Consciousness: awake, alert , oriented, and patient cooperative  Airway & Oxygen Therapy: Patient Spontanous Breathing  Post-op Assessment: Report given to RN and Post -op Vital signs reviewed and stable  Post vital signs: Reviewed and stable  Last Vitals:  Vitals Value Taken Time  BP    Temp    Pulse    Resp    SpO2      Last Pain:  Vitals:   04/27/23 0857  TempSrc:   PainSc: 0-No pain      Patients Stated Pain Goal: 4 (04/27/23 0857)  Complications: There were no known notable events for this encounter.

## 2023-04-27 NOTE — H&P (Signed)
  Electrophysiology Office Note:   Date:  04/27/2023  ID:  Ronald Stokes, DOB 02-09-1951, MRN [id number]  Primary Cardiologist: [name], MD Electrophysiologist: Regan Lemming, MD      History of Present Illness:   Ronald Stokes is a 72 y.o. male with h/o apical hypertrophic cardiomyopathy, atrial fibrillation, hyperlipidemia, prostate cancer seen today for routine electrophysiology followup.   He was previously seen in cardiology clinic.  He was noted to have a significantly elevated TSH and thus amiodarone was stopped.  Today, denies symptoms of palpitations, chest pain, shortness of breath, orthopnea, PND, lower extremity edema, claudication, dizziness, presyncope, syncope, bleeding, or neurologic sequela. The patient is tolerating medications without difficulties. Plan ablation today.   EP Information / Studies Reviewed:    EKG is not ordered today. EKG from 02/21/23 reviewed which showed atrial fibrillation        Risk Assessment/Calculations:    CHA2DS2-VASc Score = 2   This indicates a 2.2% annual risk of stroke. The patient's score is based upon: CHF History: 0 HTN History: 1 Diabetes History: 0 Stroke History: 0 Vascular Disease History: 0 Age Score: 1 Gender Score: 0            Physical Exam:   VS:  BP (!) 122/94   Pulse 86   Temp 98.3 F (36.8 C) (Temporal)   Resp 18   Ht 5\' 10"  (1.778 m)   Wt 82.6 kg   SpO2 95%   BMI 26.11 kg/m    Wt Readings from Last 3 Encounters:  04/27/23 82.6 kg  04/21/23 83.6 kg  03/20/23 84.6 kg     GEN: Well nourished, well developed in no acute distress NECK: No JVD; No carotid bruits CARDIAC: Irregularly irregular rate and rhythm, no murmurs, rubs, gallops RESPIRATORY:  Clear to auscultation without rales, wheezing or rhonchi  ABDOMEN: Soft, non-tender, non-distended EXTREMITIES:  No edema; No deformity   ASSESSMENT AND PLAN:    1.  Paroxysmal atrial fibrillation: Ronald Stokes has presented today for surgery,  with the diagnosis of AF.  The various methods of treatment have been discussed with the patient and family. After consideration of risks, benefits and other options for treatment, the patient has consented to  Procedure(s): Catheter ablation as a surgical intervention .  Risks include but not limited to complete heart block, stroke, esophageal damage, nerve damage, bleeding, vascular damage, tamponade, perforation, MI, and death. The patient's history has been reviewed, patient examined, no change in status, stable for surgery.  I have reviewed the patient's chart and labs.  Questions were answered to the patient's satisfaction.    Caia Lofaro Elberta Fortis, MD 04/27/2023 9:26 AM

## 2023-04-27 NOTE — Interval H&P Note (Signed)
History and Physical Interval Note:  04/27/2023 10:46 AM  Ronald Stokes  has presented today for surgery, with the diagnosis of afib.  The various methods of treatment have been discussed with the patient and family. After consideration of risks, benefits and other options for treatment, the patient has consented to  Procedure(s): ATRIAL FIBRILLATION ABLATION (N/A) TRANSESOPHAGEAL ECHOCARDIOGRAM (N/A) as a surgical intervention.  The patient's history has been reviewed, patient examined, no change in status, stable for surgery.  I have reviewed the patient's chart and labs.  Questions were answered to the patient's satisfaction.     Yolunda Kloos [employer]

## 2023-04-27 NOTE — Progress Notes (Signed)
  Echocardiogram Echocardiogram Transesophageal has been performed.  Delcie Roch 04/27/2023, 11:07 AM

## 2023-04-27 NOTE — Anesthesia Procedure Notes (Signed)
Procedure Name: Intubation Date/Time: 04/27/2023 10:25 AM  Performed by: Orlin Hilding, CRNAPre-anesthesia Checklist: Patient identified, Emergency Drugs available, Suction available, Patient being monitored and Timeout performed Patient Re-evaluated:Patient Re-evaluated prior to induction Oxygen Delivery Method: Circle system utilized Preoxygenation: Pre-oxygenation with 100% oxygen Induction Type: IV induction Ventilation: Mask ventilation without difficulty and Oral airway inserted - appropriate to patient size Laryngoscope Size: Mac and 4 Grade View: Grade I Tube type: Oral Tube size: 7.5 mm Number of attempts: 1 Placement Confirmation: ETT inserted through vocal cords under direct vision, positive ETCO2 and breath sounds checked- equal and bilateral Secured at: 23 cm Tube secured with: Tape Dental Injury: Teeth and Oropharynx as per pre-operative assessment

## 2023-04-27 NOTE — Discharge Instructions (Signed)

## 2023-04-28 ENCOUNTER — Encounter (HOSPITAL_COMMUNITY): Payer: Self-pay | Admitting: Cardiology

## 2023-05-08 ENCOUNTER — Other Ambulatory Visit: Payer: Self-pay

## 2023-05-08 DIAGNOSIS — I480 Paroxysmal atrial fibrillation: Secondary | ICD-10-CM

## 2023-05-08 MED ORDER — APIXABAN 5 MG PO TABS: ORAL_TABLET | ORAL | 5 refills | Status: DC

## 2023-05-08 NOTE — Telephone Encounter (Signed)
Prescription refill request for Eliquis received. Indication: Afib  Last office visit:  04/21/23 Elliot Gurney)  Scr: 1.53 (04/11/23)  Age: 72 Weight: 82.6kg  Appropriate dose. Refill sent.

## 2023-05-15 ENCOUNTER — Telehealth: Payer: Self-pay | Admitting: *Deleted

## 2023-05-15 DIAGNOSIS — I4819 Other persistent atrial fibrillation: Secondary | ICD-10-CM

## 2023-05-15 DIAGNOSIS — R931 Abnormal findings on diagnostic imaging of heart and coronary circulation: Secondary | ICD-10-CM

## 2023-05-15 NOTE — Telephone Encounter (Signed)
Pt aware he will need to complete and echocardiogram prior to his follow up with Dr. Elberta Fortis on 07/31/23. Aware office will call to arrange in our [place] office.   Patient agreeable to plan.

## 2023-05-15 NOTE — Telephone Encounter (Signed)
-----   Message from Will [facility] sent at 04/28/2023 10:10 AM EST ----- Needs repeat echo prior to 3 month post ablation visit.

## 2023-05-25 ENCOUNTER — Encounter (HOSPITAL_COMMUNITY): Payer: Self-pay | Admitting: Physician Assistant

## 2023-05-25 ENCOUNTER — Ambulatory Visit (HOSPITAL_COMMUNITY)
Admission: RE | Admit: 2023-05-25 | Discharge: 2023-05-25 | Disposition: A | Discharge status: Home / Self Care | Payer: PPO | Source: Ambulatory Visit | Attending: Physician Assistant | Admitting: Physician Assistant

## 2023-05-25 VITALS — BP 142/90 | HR 61 | Ht 70.0 in | Wt 186.0 lb

## 2023-05-25 DIAGNOSIS — E039 Hypothyroidism, unspecified: Secondary | ICD-10-CM | POA: Diagnosis not present

## 2023-05-25 DIAGNOSIS — I2510 Atherosclerotic heart disease of native coronary artery without angina pectoris: Secondary | ICD-10-CM | POA: Diagnosis not present

## 2023-05-25 DIAGNOSIS — Z7901 Long term (current) use of anticoagulants: Secondary | ICD-10-CM | POA: Diagnosis not present

## 2023-05-25 DIAGNOSIS — I5020 Unspecified systolic (congestive) heart failure: Secondary | ICD-10-CM | POA: Diagnosis not present

## 2023-05-25 DIAGNOSIS — N189 Chronic kidney disease, unspecified: Secondary | ICD-10-CM | POA: Insufficient documentation

## 2023-05-25 DIAGNOSIS — I480 Paroxysmal atrial fibrillation: Secondary | ICD-10-CM | POA: Diagnosis not present

## 2023-05-25 DIAGNOSIS — D6869 Other thrombophilia: Secondary | ICD-10-CM | POA: Insufficient documentation

## 2023-05-25 DIAGNOSIS — Z79899 Other long term (current) drug therapy: Secondary | ICD-10-CM | POA: Insufficient documentation

## 2023-05-25 DIAGNOSIS — I130 Hypertensive heart and chronic kidney disease with heart failure and stage 1 through stage 4 chronic kidney disease, or unspecified chronic kidney disease: Secondary | ICD-10-CM | POA: Insufficient documentation

## 2023-05-25 NOTE — Progress Notes (Signed)
Primary Care Physician: [name], MD Primary Cardiologist: [name], MD Electrophysiologist: Will Jorja Loa, MD  Referring Physician: Dr Felicity Coyer is a 72 y.o. male with a history of WPW, hypertrophic cardiomyopathy, CHF, hypertension, GERD, hypothyroidism, history of prostate cancer, CKD, dyslipidemia, moderate MR, CAD, atrial fibrillation who presents for follow up in the [facility]. Evaluated by Dr. Elberta Fortis in August 2023, he reported intermittent episodes of atrial fibrillation, had recently wore a monitor that revealed his A-fib burden was 24%. He followed up with Dr. Tomie China on 03/20/2023, his TSH was elevated and his amiodarone was stopped. A monitor was arranged which revealed atrial fibrillation occurred 100% of the time, 1 run of VT. Patient is s/p afib ablation with Dr Elberta Fortis on 04/27/23.  Patient is on Eliquis for a CHADS2VASC score of 4.  On follow up today, patient reports that he has done well since the ablation with no interim episodes of afib. He denies chest pain or groin issues.   Today, he denies symptoms of palpitations, chest pain, shortness of breath, orthopnea, PND, lower extremity edema, dizziness, presyncope, syncope, snoring, daytime somnolence, bleeding, or neurologic sequela. The patient is tolerating medications without difficulties and is otherwise without complaint today.    Atrial Fibrillation Risk Factors:  he does not have symptoms or diagnosis of sleep apnea. he does not have a history of rheumatic fever.   Atrial Fibrillation Management history:  Previous antiarrhythmic drugs: amiodarone  Previous cardioversions: none Previous ablations: 04/27/23 Anticoagulation history: Eliquis  ROS- All systems are reviewed and negative except as per the HPI above.  Past Medical History:  Diagnosis Date   Apical variant hypertrophic cardiomyopathy (HCC) 02/09/2015   Atrial fibrillation (HCC)     Atypical chest pain 04/18/2018   CKD (chronic kidney disease)    Essential hypertension 02/09/2015   GERD (gastroesophageal reflux disease)    History of hypertrophic cardiomyopathy 04/18/2018   Hyperlipidemia    Hypothyroidism    On amiodarone therapy 02/09/2015   PAF (paroxysmal atrial fibrillation) (HCC) 02/09/2015   Paroxysmal atrial fibrillation (HCC) 04/18/2018   Personal history of radiation therapy 04/18/2018   Prostate cancer (HCC) 04/18/2018   Status post ablation of atrial fibrillation 04/18/2018   Syncope    WPW (Wolff-Parkinson-White syndrome)     Current Outpatient Medications  Medication Sig Dispense Refill   acetaminophen (TYLENOL) 500 MG tablet Take 500 mg by mouth every 8 (eight) hours as needed for mild pain or moderate pain.     apixaban (ELIQUIS) 5 MG TABS tablet TAKE ONE TABLET BY MOUTH EVERY MORNING and TAKE ONE TABLET BY MOUTH EVERY EVENING 60 tablet 5   metoprolol succinate (TOPROL XL) 25 MG 24 hr tablet Take 1 tablet (25 mg total) by mouth daily. 90 tablet 3   OVER THE COUNTER MEDICATION Take 1 capsule by mouth daily. Ultra Male     OVER THE COUNTER MEDICATION Take 1 capsule by mouth daily. Sight Care     OVER THE COUNTER MEDICATION Take 1 tablet by mouth daily. Boostaro     zolpidem (AMBIEN) 10 MG tablet Take 10 mg by mouth at bedtime.     atorvastatin (LIPITOR) 40 MG tablet Take 1 tablet (40 mg total) by mouth at bedtime. (Patient not taking: Reported on 05/25/2023) 90 tablet 3   No current facility-administered medications for this encounter.    Physical Exam: BP (!) 142/90   Pulse 61   Ht 5\' 10"  (1.778 m)  Wt 84.4 kg   BMI 26.69 kg/m   GEN: Well nourished, well developed in no acute distress NECK: No JVD; No carotid bruits CARDIAC: Regular rate and rhythm, no murmurs, rubs, gallops RESPIRATORY:  Clear to auscultation without rales, wheezing or rhonchi  ABDOMEN: Soft, non-tender, non-distended EXTREMITIES:  No edema; No deformity   Wt Readings  from Last 3 Encounters:  05/25/23 84.4 kg  04/27/23 82.6 kg  04/21/23 83.6 kg     EKG today demonstrates  SR, ST changes similar to previous Vent. rate 61 BPM PR interval 148 ms QRS duration 78 ms QT/QTcB 452/455 ms   Echo 04/17/23 demonstrated  1. Atrial fibrillation  Left ventricular ejection fraction, by estimation, is 40 to 45%.  Left ventricular ejection fraction by 2D MOD biplane is 44.4 %. The left  ventricle has mildly decreased function. The left ventricle has no  regional wall motion abnormalities. There is mild concentric left ventricular hypertrophy. Left ventricular diastolic parameters are indeterminate. The average left ventricular global longitudinal strain is 7.4 %. The global longitudinal strain is abnormal.   2. Right ventricular systolic function is mildly reduced. The right  ventricular size is normal. There is normal pulmonary artery systolic  pressure.   3. Left atrial size was moderately dilated.   4. The mitral valve is normal in structure. Moderate mitral valve  regurgitation. No evidence of mitral stenosis.   5. The aortic valve is tricuspid. Aortic valve regurgitation is not  visualized. No aortic stenosis is present.   6. Aortic Normal DTA.   7. The inferior vena cava is normal in size with greater than 50%  respiratory variability, suggesting right atrial pressure of 3 mmHg.    CHA2DS2-VASc Score = 4  The patient's score is based upon: CHF History: 1 HTN History: 1 Diabetes History: 0 Stroke History: 0 Vascular Disease History: 1 Age Score: 1 Gender Score: 0       ASSESSMENT AND PLAN: Paroxysmal Atrial Fibrillation (ICD10:  I48.0) The patient's CHA2DS2-VASc score is 4, indicating a 4.8% annual risk of stroke.   S/p afib ablation 04/27/23 Patient appears to be maintaining SR Continue Eliquis 5 mg BID with no missed doses for 3 months post ablation Continue Toprol 25 mg daily  Secondary Hypercoagulable State (ICD10:  D68.69) The patient  is at significant risk for stroke/thromboembolism based upon his CHA2DS2-VASc Score of 4.  Continue Apixaban (Eliquis).   HTN Stable on current regimen  CAD CAC score 371 No anginal symptoms  HFrEF EF 40% on TEE prior to ablation Repeat echo scheduled for 07/12/23 Fluid status appears stable   Follow up with Dr Elberta Fortis as scheduled.        Jorja Loa PA-C [facility] [address] [place], [place] [address] [phone]

## 2023-07-11 DIAGNOSIS — E032 Hypothyroidism due to medicaments and other exogenous substances: Secondary | ICD-10-CM | POA: Diagnosis not present

## 2023-07-11 DIAGNOSIS — E785 Hyperlipidemia, unspecified: Secondary | ICD-10-CM | POA: Diagnosis not present

## 2023-07-11 DIAGNOSIS — I480 Paroxysmal atrial fibrillation: Secondary | ICD-10-CM | POA: Diagnosis not present

## 2023-07-11 DIAGNOSIS — C61 Malignant neoplasm of prostate: Secondary | ICD-10-CM | POA: Diagnosis not present

## 2023-07-11 DIAGNOSIS — N1832 Chronic kidney disease, stage 3b: Secondary | ICD-10-CM | POA: Diagnosis not present

## 2023-07-11 DIAGNOSIS — K635 Polyp of colon: Secondary | ICD-10-CM | POA: Diagnosis not present

## 2023-07-11 DIAGNOSIS — Z79899 Other long term (current) drug therapy: Secondary | ICD-10-CM | POA: Diagnosis not present

## 2023-07-11 DIAGNOSIS — I700 Atherosclerosis of aorta: Secondary | ICD-10-CM | POA: Diagnosis not present

## 2023-07-11 DIAGNOSIS — D6869 Other thrombophilia: Secondary | ICD-10-CM | POA: Diagnosis not present

## 2023-07-11 DIAGNOSIS — E049 Nontoxic goiter, unspecified: Secondary | ICD-10-CM | POA: Diagnosis not present

## 2023-07-11 DIAGNOSIS — R109 Unspecified abdominal pain: Secondary | ICD-10-CM | POA: Diagnosis not present

## 2023-07-11 DIAGNOSIS — G8929 Other chronic pain: Secondary | ICD-10-CM | POA: Diagnosis not present

## 2023-07-12 ENCOUNTER — Ambulatory Visit: Payer: PPO

## 2023-07-13 ENCOUNTER — Telehealth: Payer: Self-pay | Admitting: Cardiology

## 2023-07-13 NOTE — Telephone Encounter (Signed)
Called Iris Humble and she would like to reschedule the patient's echo that was to be done on 1/22, but was canceled due to weather.

## 2023-07-13 NOTE — Telephone Encounter (Signed)
Caller (Iris) stated patient's procedure was canceled due to weather.  Caller is following-up on rescheduling the procedure.

## 2023-07-31 ENCOUNTER — Ambulatory Visit: Payer: PPO | Admitting: Cardiology

## 2023-07-31 ENCOUNTER — Encounter: Payer: Self-pay | Admitting: Cardiology

## 2023-07-31 ENCOUNTER — Ambulatory Visit: Payer: PPO | Attending: Cardiology | Admitting: Cardiology

## 2023-07-31 VITALS — BP 130/80 | HR 56 | Ht 70.0 in | Wt 189.0 lb

## 2023-07-31 DIAGNOSIS — I5022 Chronic systolic (congestive) heart failure: Secondary | ICD-10-CM | POA: Diagnosis not present

## 2023-07-31 DIAGNOSIS — I4819 Other persistent atrial fibrillation: Secondary | ICD-10-CM

## 2023-07-31 DIAGNOSIS — I10 Essential (primary) hypertension: Secondary | ICD-10-CM

## 2023-07-31 DIAGNOSIS — D6869 Other thrombophilia: Secondary | ICD-10-CM

## 2023-07-31 DIAGNOSIS — I422 Other hypertrophic cardiomyopathy: Secondary | ICD-10-CM

## 2023-07-31 NOTE — Patient Instructions (Signed)
 Medication Instructions:  Your physician recommends that you continue on your current medications as directed. Please refer to the Current Medication list given to you today.  *If you need a refill on your cardiac medications before your next appointment, please call your pharmacy*   Lab Work: None ordered   Testing/Procedures: None ordered   Follow-Up: At [facility], you and your health needs are our priority.  As part of our continuing mission to provide you with exceptional heart care, we have created designated Provider Care Teams.  These Care Teams include your primary Cardiologist (physician) and Advanced Practice Providers (APPs -  Physician Assistants and Nurse Practitioners) who all work together to provide you with the care you need, when you need it.  We recommend signing up for the patient portal called "MyChart".  Sign up information is provided on this After Visit Summary.  MyChart is used to connect with patients for Virtual Visits (Telemedicine).  Patients are able to view lab/test results, encounter notes, upcoming appointments, etc.  Non-urgent messages can be sent to your provider as well.   To learn more about what you can do with MyChart, go to [email or web address].    Your next appointment:   2-3 week(s)  The format for your next appointment:   In Person  Provider:   Ralene Burger, MD    Thank you for choosing [facility]!!   Reece Cane, RN [phone]

## 2023-07-31 NOTE — Progress Notes (Signed)
  Electrophysiology Office Note:   Date:  07/31/2023  ID:  Ronald Stokes, DOB Feb 13, 1951, MRN [id number]  Primary Cardiologist: Ralene Burger, MD Primary Heart Failure: None Electrophysiologist: Shelonda Saxe Cortland Ding, MD      History of Present Illness:   Ronald Stokes is a 73 y.o. male with h/o WPW, hypertrophic cardiomyopathy, CHF, hypertension, GERD, hypothyroidism, CKD, moderate MR, atrial fibrillation seen today for routine electrophysiology followup.   Since last being seen in our clinic the patient reports doing overall well.  He has noted no episodes of atrial fibrillation since his ablation.  He has been able to do all of his daily activities without restriction.  he denies chest pain, palpitations, dyspnea, PND, orthopnea, nausea, vomiting, dizziness, syncope, edema, weight gain, or early satiety.   Review of systems complete and found to be negative unless listed in HPI.   EP Information / Studies Reviewed:    EKG is ordered today. Personal review as below.  EKG Interpretation Date/Time:  Monday July 31 2023 11:44:36 EST Ventricular Rate:  56 PR Interval:  152 QRS Duration:  82 QT Interval:  488 QTC Calculation: 470 R Axis:   13  Text Interpretation: Sinus bradycardia T wave abnormality, consider inferior ischemia T wave abnormality, consider anterolateral ischemia Prolonged QT Abnormal ECG When compared with ECG of 25-May-2023 15:18, T wave inversion now evident in Anterior leads Confirmed by Jsoeph Podesta ([id number]) on 07/31/2023 11:52:08 AM     Risk Assessment/Calculations:    CHA2DS2-VASc Score = 4   This indicates a 4.8% annual risk of stroke. The patient's score is based upon: CHF History: 1 HTN History: 1 Diabetes History: 0 Stroke History: 0 Vascular Disease History: 1 Age Score: 1 Gender Score: 0             Physical Exam:   VS:  BP 130/80   Pulse (!) 56   Ht 5\' 10"  (1.778 m)   Wt 189 lb (85.7 kg)   SpO2 95%   BMI 27.12 kg/m    Wt Readings from  Last 3 Encounters:  07/31/23 189 lb (85.7 kg)  05/25/23 186 lb (84.4 kg)  04/27/23 182 lb (82.6 kg)     GEN: Well nourished, well developed in no acute distress NECK: No JVD; No carotid bruits CARDIAC: Regular rate and rhythm, no murmurs, rubs, gallops RESPIRATORY:  Clear to auscultation without rales, wheezing or rhonchi  ABDOMEN: Soft, non-tender, non-distended EXTREMITIES:  No edema; No deformity   ASSESSMENT AND PLAN:    1.  Persistent atrial fibrillation: Post ablation 04/27/2023.  He has had no further episodes of atrial fibrillation.  He is feeling well without acute complaint.  Melvin Marmo continue with current management.  2.  Secondary hypercoagulable state: Currently on Eliquis  5 mg twice daily for atrial fibrillation  3.  Apical variant hypertrophic cardiomyopathy: No ventricular arrhythmias noted.  No indication for ICD at this time.  He does have T wave inversions that have been changing over the last few months.  Sharene Krikorian get a repeat EKG in 2 weeks when when he sees primary cardiology.  4.  Hypertension: Currently well-controlled  5.  Chronic systolic heart failure: Ejection fraction was reduced at the time of ablation.  He was in atrial fibrillation.  He has an upcoming echo.  Case discussed with primary cardiology  Follow up with Dr. Lawana Pray in 6 months  Signed, Pauleen Goleman Cortland Ding, MD

## 2023-08-03 ENCOUNTER — Ambulatory Visit: Payer: PPO | Attending: Cardiology

## 2023-08-03 DIAGNOSIS — I4819 Other persistent atrial fibrillation: Secondary | ICD-10-CM | POA: Diagnosis not present

## 2023-08-03 DIAGNOSIS — R931 Abnormal findings on diagnostic imaging of heart and coronary circulation: Secondary | ICD-10-CM

## 2023-08-03 LAB — ECHOCARDIOGRAM COMPLETE: S' Lateral: 3.3 cm

## 2023-08-04 ENCOUNTER — Telehealth: Payer: Self-pay | Admitting: Cardiology

## 2023-08-04 NOTE — Telephone Encounter (Signed)
Patient's friend would like a call back to discuss echo.

## 2023-08-04 NOTE — Telephone Encounter (Signed)
Returned call to Iris (on DPR) Aware of improved EF on recent echo and this improved from 40% pre ablation. Iris verbalized understanding and appreciates my return call.

## 2023-08-08 ENCOUNTER — Telehealth: Payer: Self-pay | Admitting: *Deleted

## 2023-08-08 NOTE — Telephone Encounter (Signed)
 Informed patient of results and verbal understanding expressed.

## 2023-08-08 NOTE — Telephone Encounter (Signed)
 Left message to call back   (EF improved/normalized post ablation)

## 2023-08-08 NOTE — Telephone Encounter (Signed)
-----   Message from Will [facility] sent at 08/03/2023  1:12 PM EST ----- Unchanged TTE

## 2023-08-11 ENCOUNTER — Encounter: Payer: Self-pay | Admitting: Cardiology

## 2023-08-11 ENCOUNTER — Ambulatory Visit: Payer: PPO | Attending: Cardiology | Admitting: Cardiology

## 2023-08-11 VITALS — BP 132/76 | HR 83 | Ht 70.0 in | Wt 185.8 lb

## 2023-08-11 DIAGNOSIS — Z8679 Personal history of other diseases of the circulatory system: Secondary | ICD-10-CM

## 2023-08-11 DIAGNOSIS — I456 Pre-excitation syndrome: Secondary | ICD-10-CM

## 2023-08-11 DIAGNOSIS — I10 Essential (primary) hypertension: Secondary | ICD-10-CM

## 2023-08-11 DIAGNOSIS — E782 Mixed hyperlipidemia: Secondary | ICD-10-CM | POA: Diagnosis not present

## 2023-08-11 DIAGNOSIS — I480 Paroxysmal atrial fibrillation: Secondary | ICD-10-CM | POA: Diagnosis not present

## 2023-08-11 DIAGNOSIS — Z9889 Other specified postprocedural states: Secondary | ICD-10-CM

## 2023-08-11 DIAGNOSIS — I422 Other hypertrophic cardiomyopathy: Secondary | ICD-10-CM | POA: Diagnosis not present

## 2023-08-11 NOTE — Patient Instructions (Signed)
 Medication Instructions:  Your physician recommends that you continue on your current medications as directed. Please refer to the Current Medication list given to you today.  *If you need a refill on your cardiac medications before your next appointment, please call your pharmacy*   Lab Work: Lipid, AST, ALT- today If you have labs (blood work) drawn today and your tests are completely normal, you will receive your results only by: MyChart Message (if you have MyChart) OR A paper copy in the mail If you have any lab test that is abnormal or we need to change your treatment, we will call you to review the results.   Testing/Procedures: Your physician has requested that you have a lexiscan myoview. For further information please visit [email or web address] Please follow instruction sheet, as given.  The test will take approximately 3 to 4 hours to complete; you may bring reading material.  If someone comes with you to your appointment, they will need to remain in the main lobby due to limited space in the testing area.    How to prepare for your Myocardial Perfusion Test: Do not eat or drink 3 hours prior to your test, except you may have water. Do not consume products containing caffeine (regular or decaffeinated) 12 hours prior to your test. (ex: coffee, chocolate, sodas, tea). Do bring a list of your current medications with you.  If not listed below, you may take your medications as normal. Do wear comfortable clothes (no dresses or overalls) and walking shoes, tennis shoes preferred (No heels or open toe shoes are allowed). Do NOT wear cologne, perfume, aftershave, or lotions (deodorant is allowed). If these instructions are not followed, your test will have to be rescheduled.     Follow-Up: At [facility], you and your health needs are our priority.  As part of our continuing mission to provide you with exceptional heart care, we have created designated Provider Care Teams.  These  Care Teams include your primary Cardiologist (physician) and Advanced Practice Providers (APPs -  Physician Assistants and Nurse Practitioners) who all work together to provide you with the care you need, when you need it.  We recommend signing up for the patient portal called "MyChart".  Sign up information is provided on this After Visit Summary.  MyChart is used to connect with patients for Virtual Visits (Telemedicine).  Patients are able to view lab/test results, encounter notes, upcoming appointments, etc.  Non-urgent messages can be sent to your provider as well.   To learn more about what you can do with MyChart, go to [email or web address].    Your next appointment:   6 month(s)  The format for your next appointment:   In Person  Provider:   [name], MD    Other Instructions NA

## 2023-08-11 NOTE — Addendum Note (Signed)
 Addended by: [name] on: 08/11/2023 08:37 AM   Modules accepted: Orders

## 2023-08-11 NOTE — Progress Notes (Addendum)
 Cardiology Office Note:    Date:  08/11/2023   ID:  Ronald Stokes, DOB 09/26/1950, MRN [id number]  PCP:  [name], MD  Cardiologist:  [name], MD    Referring MD: [name], MD   Chief Complaint  Patient presents with   Follow-up    History of Present Illness:    Ronald Stokes is a 73 y.o. male past medical history significant for apical variant of hypertrophic cardiomyopathy, diagnosis has been established 7 years ago PCP for formal for many years however when he showed up again in December 2021 he was noted to have paroxysmal atrial fibrillation, additional problem include dyslipidemia prostate cancer history of WPW status post ablation of atrial fibrillation.  He comes today to months for follow-up recently he seen our EP team and he did have an EKG done EKG showed some symmetrical T inversion in lateral leads it look like a fairly classical for hypertrophic cardiomyopathy but this is something completely new he did not have it before.  He described to have some chest pain but those pains atypical does happen typically at rest when he is sitting.  He does not happily walk on the exercise.  He denies have any dizziness or passing out the only dizziness he got is sometimes when he gets up quickly in the morning.  But no palpitations  Past Medical History:  Diagnosis Date   Apical variant hypertrophic cardiomyopathy (HCC) 02/09/2015   Atrial fibrillation (HCC)    Atypical chest pain 04/18/2018   CKD (chronic kidney disease)    Essential hypertension 02/09/2015   GERD (gastroesophageal reflux disease)    History of hypertrophic cardiomyopathy 04/18/2018   Hyperlipidemia    Hypothyroidism    On amiodarone therapy 02/09/2015   PAF (paroxysmal atrial fibrillation) (HCC) 02/09/2015   Paroxysmal atrial fibrillation (HCC) 04/18/2018   Personal history of radiation therapy 04/18/2018   Prostate cancer (HCC) 04/18/2018   Status post ablation of atrial fibrillation  04/18/2018   Syncope    WPW (Wolff-Parkinson-White syndrome)     Past Surgical History:  Procedure Laterality Date   ATRIAL FIBRILLATION ABLATION N/A 04/27/2023   Procedure: ATRIAL FIBRILLATION ABLATION;  Surgeon: Regan Lemming, MD;  Location: [facility] INVASIVE CV LAB;  Service: Cardiovascular;  Laterality: N/A;   COLONOSCOPY  05/28/2014   Colonic polyp status post polypectomy. Moderate sigmoid diverticulosis. Limited examination due to quality of preparation. \   ESOPHAGOGASTRODUODENOSCOPY  12/18/2014   Normal EGD.    HERNIA REPAIR     ROTATOR CUFF REPAIR     TRANSESOPHAGEAL ECHOCARDIOGRAM (CATH LAB) N/A 04/27/2023   Procedure: TRANSESOPHAGEAL ECHOCARDIOGRAM;  Surgeon: Regan Lemming, MD;  [facility] INV[facility]IVE CV LAB;  Service: Cardiovascular;  Laterality: N/A;   WPW corrective surgery      Current Medications: Current Meds  Medication Sig   acetaminophen (TYLENOL) 500 MG tablet Take 500 mg by mouth every 8 (eight) hours as needed for mild pain or moderate pain.   apixaban (ELIQUIS) 5 MG TABS tablet TAKE ONE TABLET BY MOUTH EVERY MORNING and TAKE ONE TABLET BY MOUTH EVERY EVENING (Patient taking differently: Take 5 mg by mouth 2 (two) times daily. TAKE ONE TABLET BY MOUTH EVERY MORNING and TAKE ONE TABLET BY MOUTH EVERY EVENING)   atorvastatin (LIPITOR) 40 MG tablet Take 1 tablet (40 mg total) by mouth at bedtime.   LEVOTHYROXINE SODIUM PO Take 1 tablet by mouth daily. Unsure of strength   metoprolol succinate (TOPROL XL) 25 MG 24 hr tablet Take 1  tablet (25 mg total) by mouth daily.   OVER THE COUNTER MEDICATION Take 1 capsule by mouth daily. Ultra Male   OVER THE COUNTER MEDICATION Take 1 capsule by mouth daily. Sight Care   OVER THE COUNTER MEDICATION Take 1 tablet by mouth daily. Boostaro   zolpidem (AMBIEN) 10 MG tablet Take 10 mg by mouth at bedtime.     Allergies:   Patient has no known allergies.   Social History   Socioeconomic History   Marital status: Married     Spouse name: Not on file   Number of children: Not on file   Years of education: Not on file   Highest education level: Not on file  Occupational History   Not on file  Tobacco Use   Smoking status: Former    Types: Cigars   Smokeless tobacco: Former   Tobacco comments:    Former smoker 05/25/23  Vaping Use   Vaping status: Never Used  Substance and Sexual Activity   Alcohol use: Yes    Comment: ocassionally, beer once in a while   Drug use: Never   Sexual activity: Not on file  Other Topics Concern   Not on file  Social History Narrative   Not on file   Social Drivers of Health   Financial Resource Strain: Not on file  Food Insecurity: Not on file  Transportation Needs: Not on file  Physical Activity: Not on file  Stress: Not on file  Social Connections: Not on file     Family History: The patient's family history includes Heart disease in his father. There is no history of Colon cancer or Esophageal cancer. ROS:   Please see the history of present illness.    All 14 point review of systems negative except as described per history of present illness  EKGs/Labs/Other Studies Reviewed:         Recent Labs: 02/16/2023: Magnesium 2.5; TSH 91.400 02/21/2023: ALT 17 04/11/2023: BUN 20; Creatinine, Ser 1.53; Hemoglobin 15.1; Platelets 168; Potassium 4.4; Sodium 144  Recent Lipid Panel    Component Value Date/Time   CHOL 247 (H) 09/21/2021 1552   TRIG 215 (H) 09/21/2021 1552   HDL 52 09/21/2021 1552   CHOLHDL 4.8 09/21/2021 1552   LDLCALC 156 (H) 09/21/2021 1552    Physical Exam:    VS:  BP 132/76 (BP Location: Right Arm, Patient Position: Sitting)   Pulse 83   Ht 5\' 10"  (1.778 m)   Wt 185 lb 12.8 oz (84.3 kg)   SpO2 98%   BMI 26.66 kg/m     Wt Readings from Last 3 Encounters:  08/11/23 185 lb 12.8 oz (84.3 kg)  07/31/23 189 lb (85.7 kg)  05/25/23 186 lb (84.4 kg)     GEN:  Well nourished, well developed in no acute distress HEENT: Normal NECK:  No JVD; No carotid bruits LYMPHATICS: No lymphadenopathy CARDIAC: RRR, no murmurs, no rubs, no gallops RESPIRATORY:  Clear to auscultation without rales, wheezing or rhonchi  ABDOMEN: Soft, non-tender, non-distended MUSCULOSKELETAL:  No edema; No deformity  SKIN: Warm and dry LOWER EXTREMITIES: no swelling NEUROLOGIC:  Alert and oriented x 3 PSYCHIATRIC:  Normal affect   ASSESSMENT:    1. Apical variant hypertrophic cardiomyopathy (HCC)   2. Essential hypertension   3. PAF (paroxysmal atrial fibrillation) (HCC)   4. WPW (Wolff-Parkinson-White syndrome)   5. Status post ablation of atrial fibrillation    PLAN:    In order of problems listed above:  Apical variant  of hypertrophic cardiomyopathy doing well denies have any syncope.  His EKG does have new changes interestingly this EKG now looks like a typical for apical variant hypertrophic cardiomyopathy, but since, he have some atypical symptoms I will schedule him to have exercise Cardiolite.  Another argument to do that is the fact that he wants to try exercise room on her make sure it safe from arrhythmia point review. Dyslipidemia I did review K PN which show me his total cholesterol 338!  His HDL 42 LDL was 156.  He was put on Lipitor 40 we will recheck his fasting lipid profile today. Paroxysmal atrial fibrillation denies have any palpitation, maintain sinus rhythm, and anticoagulation of antiarrhythmic continue monitoring. WPW no reactivation of the problem. Status post atrial fibrillation ablation.  Noted Is supposed to be scheduled for colonoscopy, from cardiac standpoint if he should be fine we will wait for results of stress test stress test is negative good to proceed if we need to stop anticoagulation for 2 days before colonoscopy should be fine this is more than 3 months after ablation   Medication Adjustments/Labs and Tests Ordered: Current medicines are reviewed at length with the patient today.  Concerns regarding  medicines are outlined above.  No orders of the defined types were placed in this encounter.  Medication changes: No orders of the defined types were placed in this encounter.   Signed, Georgeanna Lea, MD, [facility] 08/11/2023 8:14 AM    [facility]

## 2023-08-11 NOTE — Addendum Note (Signed)
 Addended by: Baldo Ash D on: 08/11/2023 08:36 AM   Modules accepted: Orders

## 2023-08-12 LAB — LIPID PANEL
Chol/HDL Ratio: 6.5 ratio — ABNORMAL HIGH (ref 0.0–5.0)
Cholesterol, Total: 228 mg/dL — ABNORMAL HIGH (ref 100–199)
HDL: 35 mg/dL — ABNORMAL LOW (ref >39)
LDL Chol Calc (NIH): 137 mg/dL — ABNORMAL HIGH (ref 0–99)
Triglycerides: 307 mg/dL — ABNORMAL HIGH (ref 0–149)
VLDL Cholesterol Cal: 56 mg/dL — ABNORMAL HIGH (ref 5–40)

## 2023-08-12 LAB — AST: AST: 23 IU/L (ref 0–40)

## 2023-08-12 LAB — ALT: ALT: 20 IU/L (ref 0–44)

## 2023-08-15 ENCOUNTER — Ambulatory Visit: Payer: PPO | Attending: Cardiology

## 2023-08-15 DIAGNOSIS — I422 Other hypertrophic cardiomyopathy: Secondary | ICD-10-CM

## 2023-08-15 LAB — MYOCARDIAL PERFUSION IMAGING
LV dias vol: 117 mL (ref 62–150)
LV sys vol: 55 mL
Nuc Stress EF: 53 %
Peak HR: 73 {beats}/min
Rest HR: 55 {beats}/min
Rest Nuclear Isotope Dose: 10.9 mCi
SDS: 2
SRS: 2
SSS: 4
Stress Nuclear Isotope Dose: 31.2 mCi
TID: 1.09

## 2023-08-15 MED ORDER — TECHNETIUM TC 99M TETROFOSMIN IV KIT
31.2000 | PACK | Freq: Once | INTRAVENOUS | Status: AC | PRN
  Administered 2023-08-15: 31.2 via INTRAVENOUS

## 2023-08-15 MED ORDER — TECHNETIUM TC 99M TETROFOSMIN IV KIT
10.9000 | PACK | Freq: Once | INTRAVENOUS | Status: AC | PRN
  Administered 2023-08-15: 10.9 via INTRAVENOUS

## 2023-08-15 MED ORDER — REGADENOSON 0.4 MG/5ML IV SOLN
0.4000 mg | Freq: Once | INTRAVENOUS | Status: AC
  Administered 2023-08-15: 0.4 mg via INTRAVENOUS

## 2023-08-16 ENCOUNTER — Telehealth: Payer: Self-pay

## 2023-08-16 NOTE — Telephone Encounter (Signed)
 Results reviewed with pt as per Dr. Madireddy's note.  Pt verbalized understanding and had no additional questions. Routed to PCP

## 2023-08-18 ENCOUNTER — Telehealth: Payer: Self-pay

## 2023-08-18 DIAGNOSIS — E782 Mixed hyperlipidemia: Secondary | ICD-10-CM

## 2023-08-18 MED ORDER — EZETIMIBE 10 MG PO TABS: 10.0000 mg | ORAL_TABLET | Freq: Every day | ORAL | 3 refills | Status: DC

## 2023-08-18 NOTE — Telephone Encounter (Signed)
 Lab Results reviewed with pt as per Dr. Hulen Shouts note.  Pt verbalized understanding and had no additional questions. Zetia 10mg  1 tablet daily sent to [facility]. Lipid panel in 2 months.  Routed to PCP

## 2023-10-10 DIAGNOSIS — E039 Hypothyroidism, unspecified: Secondary | ICD-10-CM | POA: Diagnosis not present

## 2023-10-10 DIAGNOSIS — D649 Anemia, unspecified: Secondary | ICD-10-CM | POA: Diagnosis not present

## 2023-10-10 DIAGNOSIS — K219 Gastro-esophageal reflux disease without esophagitis: Secondary | ICD-10-CM | POA: Diagnosis not present

## 2023-10-10 DIAGNOSIS — E782 Mixed hyperlipidemia: Secondary | ICD-10-CM | POA: Diagnosis not present

## 2023-10-10 DIAGNOSIS — D6869 Other thrombophilia: Secondary | ICD-10-CM | POA: Diagnosis not present

## 2023-10-10 DIAGNOSIS — Z6826 Body mass index (BMI) 26.0-26.9, adult: Secondary | ICD-10-CM | POA: Diagnosis not present

## 2023-10-10 DIAGNOSIS — I480 Paroxysmal atrial fibrillation: Secondary | ICD-10-CM | POA: Diagnosis not present

## 2023-10-10 DIAGNOSIS — R1013 Epigastric pain: Secondary | ICD-10-CM | POA: Diagnosis not present

## 2023-10-10 DIAGNOSIS — R0789 Other chest pain: Secondary | ICD-10-CM | POA: Diagnosis not present

## 2023-10-10 DIAGNOSIS — I700 Atherosclerosis of aorta: Secondary | ICD-10-CM | POA: Diagnosis not present

## 2023-10-11 DIAGNOSIS — H25813 Combined forms of age-related cataract, bilateral: Secondary | ICD-10-CM | POA: Diagnosis not present

## 2023-10-11 DIAGNOSIS — H11041 Peripheral pterygium, stationary, right eye: Secondary | ICD-10-CM | POA: Diagnosis not present

## 2023-10-11 DIAGNOSIS — H524 Presbyopia: Secondary | ICD-10-CM | POA: Diagnosis not present

## 2023-10-11 DIAGNOSIS — H35371 Puckering of macula, right eye: Secondary | ICD-10-CM | POA: Diagnosis not present

## 2023-10-11 DIAGNOSIS — H47323 Drusen of optic disc, bilateral: Secondary | ICD-10-CM | POA: Diagnosis not present

## 2023-10-12 ENCOUNTER — Other Ambulatory Visit (HOSPITAL_BASED_OUTPATIENT_CLINIC_OR_DEPARTMENT_OTHER): Payer: Self-pay | Admitting: Family Medicine

## 2023-10-12 DIAGNOSIS — R1013 Epigastric pain: Secondary | ICD-10-CM

## 2023-10-18 DIAGNOSIS — I480 Paroxysmal atrial fibrillation: Secondary | ICD-10-CM | POA: Diagnosis not present

## 2023-10-18 DIAGNOSIS — I422 Other hypertrophic cardiomyopathy: Secondary | ICD-10-CM | POA: Diagnosis not present

## 2023-10-18 DIAGNOSIS — Z720 Tobacco use: Secondary | ICD-10-CM | POA: Diagnosis not present

## 2023-10-18 DIAGNOSIS — N1831 Chronic kidney disease, stage 3a: Secondary | ICD-10-CM | POA: Diagnosis not present

## 2023-10-20 ENCOUNTER — Ambulatory Visit: Attending: Cardiology | Admitting: Cardiology

## 2023-10-20 ENCOUNTER — Encounter: Payer: Self-pay | Admitting: Cardiology

## 2023-10-20 VITALS — BP 118/80 | HR 69 | Ht 69.0 in | Wt 183.2 lb

## 2023-10-20 DIAGNOSIS — I480 Paroxysmal atrial fibrillation: Secondary | ICD-10-CM

## 2023-10-20 DIAGNOSIS — Z9889 Other specified postprocedural states: Secondary | ICD-10-CM

## 2023-10-20 DIAGNOSIS — I10 Essential (primary) hypertension: Secondary | ICD-10-CM | POA: Diagnosis not present

## 2023-10-20 DIAGNOSIS — Z8679 Personal history of other diseases of the circulatory system: Secondary | ICD-10-CM

## 2023-10-20 DIAGNOSIS — I456 Pre-excitation syndrome: Secondary | ICD-10-CM | POA: Diagnosis not present

## 2023-10-20 DIAGNOSIS — I422 Other hypertrophic cardiomyopathy: Secondary | ICD-10-CM

## 2023-10-20 MED ORDER — METOPROLOL SUCCINATE ER 25 MG PO TB24: 25.0000 mg | ORAL_TABLET | Freq: Every day | ORAL | 3 refills | Status: DC

## 2023-10-20 NOTE — Progress Notes (Signed)
 Cardiology Office Note:    Date:  10/20/2023   ID:  Ronald Stokes, DOB [birth year], MRN [id number]  PCP:  Harvest Lineman, MD  Cardiologist:  Ralene Burger, MD    Referring MD: Harvest Lineman, MD   Chief Complaint  Patient presents with   Chest Pain    History of Present Illness:    Ronald Stokes is a 73 y.o. male past medical history significant for apical variant hypertrophic cardiomyopathy diagnosed established 7 years ago, history of WPW status post ablation, paroxysmal atrial fibrillation status post ablation, dyslipidemia, prostate cancer. Comes today to months for follow-up.  Doing great asymptomatic no chest pain tightness squeezing pressure burning chest no palpitation dizziness swelling of lower extremities  Past Medical History:  Diagnosis Date   Apical variant hypertrophic cardiomyopathy (HCC) 02/09/2015   Atrial fibrillation (HCC)    Atypical chest pain 04/18/2018   CKD (chronic kidney disease)    Essential hypertension 02/09/2015   GERD (gastroesophageal reflux disease)    History of hypertrophic cardiomyopathy 04/18/2018   Hyperlipidemia    Hypothyroidism    On amiodarone  therapy 02/09/2015   PAF (paroxysmal atrial fibrillation) (HCC) 02/09/2015   Paroxysmal atrial fibrillation (HCC) 04/18/2018   Personal history of radiation therapy 04/18/2018   Prostate cancer (HCC) 04/18/2018   Status post ablation of atrial fibrillation 04/18/2018   Syncope    WPW (Wolff-Parkinson-White syndrome)     Past Surgical History:  Procedure Laterality Date   ATRIAL FIBRILLATION ABLATION N/A 04/27/2023   Procedure: ATRIAL FIBRILLATION ABLATION;  Surgeon: Lei Pump, MD;  Location: [facility] INVASIVE CV LAB;  Service: Cardiovascular;  Laterality: N/A;   COLONOSCOPY  05/28/2014   Colonic polyp status post polypectomy. Moderate sigmoid diverticulosis. Limited examination due to quality of preparation. \   ESOPHAGOGASTRODUODENOSCOPY  12/18/2014   Normal EGD.    HERNIA REPAIR      ROTATOR CUFF REPAIR     TRANSESOPHAGEAL ECHOCARDIOGRAM (CATH LAB) N/A 04/27/2023   Procedure: TRANSESOPHAGEAL ECHOCARDIOGRAM;  Surgeon: Lei Pump, MD;  Location: [facility] INVASIVE CV LAB;  Service: Cardiovascular;  Laterality: N/A;   WPW corrective surgery      Current Medications: Current Meds  Medication Sig   acetaminophen  (TYLENOL ) 500 MG tablet Take 500 mg by mouth every 8 (eight) hours as needed for mild pain or moderate pain.   apixaban  (ELIQUIS ) 5 MG TABS tablet TAKE ONE TABLET BY MOUTH EVERY MORNING and TAKE ONE TABLET BY MOUTH EVERY EVENING (Patient taking differently: Take 5 mg by mouth 2 (two) times daily. TAKE ONE TABLET BY MOUTH EVERY MORNING and TAKE ONE TABLET BY MOUTH EVERY EVENING)   atorvastatin  (LIPITOR) 40 MG tablet Take 1 tablet (40 mg total) by mouth at bedtime.   ezetimibe  (ZETIA ) 10 MG tablet Take 1 tablet (10 mg total) by mouth daily.   LEVOTHYROXINE  SODIUM PO Take 1 tablet by mouth daily. Unsure of strength   metoprolol  succinate (TOPROL  XL) 25 MG 24 hr tablet Take 1 tablet (25 mg total) by mouth daily.   OVER THE COUNTER MEDICATION Take 1 capsule by mouth daily. Ultra Male   OVER THE COUNTER MEDICATION Take 1 capsule by mouth daily. Sight Care   OVER THE COUNTER MEDICATION Take 1 tablet by mouth daily. Boostaro   zolpidem (AMBIEN) 10 MG tablet Take 10 mg by mouth at bedtime.     Allergies:   Patient has no known allergies.   Social History   Socioeconomic History   Marital status: Married    Spouse name: Not  on file   Number of children: Not on file   Years of education: Not on file   Highest education level: Not on file  Occupational History   Not on file  Tobacco Use   Smoking status: Former    Types: Cigars   Smokeless tobacco: Former   Tobacco comments:    Former smoker 05/25/23  Vaping Use   Vaping status: Never Used  Substance and Sexual Activity   Alcohol use: Yes    Comment: ocassionally, beer once in a while   Drug use: Never    Sexual activity: Not on file  Other Topics Concern   Not on file  Social History Narrative   Not on file   Social Drivers of Health   Financial Resource Strain: Not on file  Food Insecurity: Not on file  Transportation Needs: Not on file  Physical Activity: Not on file  Stress: Not on file  Social Connections: Not on file     Family History: The patient's family history includes Heart disease in his father. There is no history of Colon cancer or Esophageal cancer. ROS:   Please see the history of present illness.    All 14 point review of systems negative except as described per history of present illness  EKGs/Labs/Other Studies Reviewed:    EKG Interpretation Date/Time:  Friday Oct 20 2023 13:09:33 EDT Ventricular Rate:  63 PR Interval:  148 QRS Duration:  84 QT Interval:  444 QTC Calculation: 454 R Axis:   17  Text Interpretation: Normal sinus rhythm T wave abnormality, consider inferior ischemia T wave abnormality, consider anterolateral ischemia Abnormal ECG When compared with ECG of 31-Jul-2023 11:44, No significant change was found Confirmed by Ralene Burger [phone]) on 10/20/2023 1:15:36 PM    Recent Labs: 02/16/2023: Magnesium 2.5; TSH 91.400 04/11/2023: BUN 20; Creatinine, Ser 1.53; Hemoglobin 15.1; Platelets 168; Potassium 4.4; Sodium 144 08/11/2023: ALT 20  Recent Lipid Panel    Component Value Date/Time   CHOL 228 (H) 08/11/2023 0839   TRIG 307 (H) 08/11/2023 0839   HDL 35 (L) 08/11/2023 0839   CHOLHDL 6.5 (H) 08/11/2023 0839   LDLCALC 137 (H) 08/11/2023 0839    Physical Exam:    VS:  BP 118/80 (BP Location: Right Arm, Patient Position: Sitting)   Pulse 69   Ht 5\' 9"  (1.753 m)   Wt 183 lb 3.2 oz (83.1 kg)   SpO2 95%   BMI 27.05 kg/m     Wt Readings from Last 3 Encounters:  10/20/23 183 lb 3.2 oz (83.1 kg)  08/15/23 185 lb (83.9 kg)  08/11/23 185 lb 12.8 oz (84.3 kg)     GEN:  Well nourished, well developed in no acute distress HEENT:  Normal NECK: No JVD; No carotid bruits LYMPHATICS: No lymphadenopathy CARDIAC: RRR, no murmurs, no rubs, no gallops RESPIRATORY:  Clear to auscultation without rales, wheezing or rhonchi  ABDOMEN: Soft, non-tender, non-distended MUSCULOSKELETAL:  No edema; No deformity  SKIN: Warm and dry LOWER EXTREMITIES: no swelling NEUROLOGIC:  Alert and oriented x 3 PSYCHIATRIC:  Normal affect   ASSESSMENT:    1. Essential hypertension   2. Apical variant hypertrophic cardiomyopathy (HCC)   3. PAF (paroxysmal atrial fibrillation) (HCC)   4. WPW (Wolff-Parkinson-White syndrome)   5. Status post ablation of atrial fibrillation    PLAN:    In order of problems listed above:  Apical variant hypertrophic obstructive cardiomyopathy stable recent stress test negative.  Will continue beta-blockade. Essential hypertension blood pressure  well-controlled continue present management. Dyslipidemia I did review K PN which show me LDL 137 HDL 34 I discovered today that he does not take his medication on the regular basis I gave him big dog how important is to take this medication hopefully will continue. Paroxysmal atrial fibrillation status post ablation maintaining sinus rhythm he must continue taking anticoagulation with explained to him hopeful he will comply   Medication Adjustments/Labs and Tests Ordered: Current medicines are reviewed at length with the patient today.  Concerns regarding medicines are outlined above.  Orders Placed This Encounter  Procedures   EKG 12-Lead   Medication changes: No orders of the defined types were placed in this encounter.   Signed, Manfred Seed, MD, [facility] 10/20/2023 1:25 PM    [facility]

## 2023-10-20 NOTE — Addendum Note (Signed)
 Addended by: Shawnee Dellen D on: 10/20/2023 01:32 PM   Modules accepted: Orders

## 2023-10-20 NOTE — Patient Instructions (Signed)
Medication Instructions:  Your physician recommends that you continue on your current medications as directed. Please refer to the Current Medication list given to you today.  *If you need a refill on your cardiac medications before your next appointment, please call your pharmacy*   Lab Work: None Ordered If you have labs (blood work) drawn today and your tests are completely normal, you will receive your results only by: MyChart Message (if you have MyChart) OR A paper copy in the mail If you have any lab test that is abnormal or we need to change your treatment, we will call you to review the results.   Testing/Procedures: None Ordered   Follow-Up: At [facility], you and your health needs are our priority.  As part of our continuing mission to provide you with exceptional heart care, we have created designated Provider Care Teams.  These Care Teams include your primary Cardiologist (physician) and Advanced Practice Providers (APPs -  Physician Assistants and Nurse Practitioners) who all work together to provide you with the care you need, when you need it.  We recommend signing up for the patient portal called "MyChart".  Sign up information is provided on this After Visit Summary.  MyChart is used to connect with patients for Virtual Visits (Telemedicine).  Patients are able to view lab/test results, encounter notes, upcoming appointments, etc.  Non-urgent messages can be sent to your provider as well.   To learn more about what you can do with MyChart, go to [email or web address].    Your next appointment:   5 month(s)  The format for your next appointment:   In Person  Provider:   Robert Krasowski, MD    Other Instructions NA  

## 2023-10-26 ENCOUNTER — Ambulatory Visit (HOSPITAL_BASED_OUTPATIENT_CLINIC_OR_DEPARTMENT_OTHER)
Admission: RE | Admit: 2023-10-26 | Discharge: 2023-10-26 | Disposition: A | Discharge status: Home / Self Care | Source: Ambulatory Visit | Attending: Family Medicine | Admitting: Family Medicine

## 2023-10-26 DIAGNOSIS — R1013 Epigastric pain: Secondary | ICD-10-CM | POA: Diagnosis not present

## 2023-10-26 DIAGNOSIS — I700 Atherosclerosis of aorta: Secondary | ICD-10-CM | POA: Diagnosis not present

## 2023-10-26 DIAGNOSIS — R1031 Right lower quadrant pain: Secondary | ICD-10-CM | POA: Diagnosis not present

## 2023-10-26 DIAGNOSIS — K5730 Diverticulosis of large intestine without perforation or abscess without bleeding: Secondary | ICD-10-CM | POA: Diagnosis not present

## 2023-10-26 DIAGNOSIS — N281 Cyst of kidney, acquired: Secondary | ICD-10-CM | POA: Diagnosis not present

## 2023-10-26 LAB — I-STAT CREATININE (MANUAL ENTRY): Creatinine, Ser: 1.7 — AB (ref 0.50–1.10)

## 2023-10-26 MED ORDER — IOHEXOL 300 MG/ML  SOLN
100.0000 mL | Freq: Once | INTRAMUSCULAR | Status: AC | PRN
  Administered 2023-10-26: 80 mL via INTRAVENOUS

## 2023-10-27 DIAGNOSIS — I480 Paroxysmal atrial fibrillation: Secondary | ICD-10-CM | POA: Diagnosis not present

## 2023-10-27 DIAGNOSIS — Z1211 Encounter for screening for malignant neoplasm of colon: Secondary | ICD-10-CM | POA: Diagnosis not present

## 2023-10-27 DIAGNOSIS — E039 Hypothyroidism, unspecified: Secondary | ICD-10-CM | POA: Diagnosis not present

## 2023-10-27 DIAGNOSIS — I456 Pre-excitation syndrome: Secondary | ICD-10-CM | POA: Diagnosis not present

## 2023-10-27 DIAGNOSIS — Z6825 Body mass index (BMI) 25.0-25.9, adult: Secondary | ICD-10-CM | POA: Diagnosis not present

## 2023-10-27 DIAGNOSIS — E782 Mixed hyperlipidemia: Secondary | ICD-10-CM | POA: Diagnosis not present

## 2023-11-08 ENCOUNTER — Other Ambulatory Visit: Payer: Self-pay

## 2023-11-08 DIAGNOSIS — I480 Paroxysmal atrial fibrillation: Secondary | ICD-10-CM

## 2023-11-08 MED ORDER — APIXABAN 5 MG PO TABS: ORAL_TABLET | ORAL | 5 refills | Status: DC

## 2023-11-08 NOTE — Telephone Encounter (Signed)
 Received faxed refill request for Eliquis  from [facility]. Pt last saw Dr Gordan Latina 10/20/23, last labs 10/26/23 Creat 1.7, age 73, weight 83.1kg, based on specified criteria pt is on appropriate dosage of Eliquis  5mg  BID for afib.  Will refill rx.

## 2023-11-20 DIAGNOSIS — Z7901 Long term (current) use of anticoagulants: Secondary | ICD-10-CM | POA: Diagnosis not present

## 2023-11-20 DIAGNOSIS — K219 Gastro-esophageal reflux disease without esophagitis: Secondary | ICD-10-CM | POA: Diagnosis not present

## 2023-11-20 DIAGNOSIS — R7989 Other specified abnormal findings of blood chemistry: Secondary | ICD-10-CM | POA: Diagnosis not present

## 2023-11-20 DIAGNOSIS — R109 Unspecified abdominal pain: Secondary | ICD-10-CM | POA: Diagnosis not present

## 2023-11-22 ENCOUNTER — Telehealth: Payer: Self-pay

## 2023-11-22 NOTE — Telephone Encounter (Signed)
   Pre-operative Risk Assessment    Patient Name: Ronald Stokes  DOB: [birth year] MRN: [id number]   Date of last office visit: 10/20/2023 Date of next office visit: 02/09/2024   Request for Surgical Clearance    Procedure:  Colonoscopy  Date of Surgery:  Clearance TBD                               Surgeon:  Dr. Micki Alas Surgeon's Group or Practice Name:  [facility]  Phone number:  [phone] Fax number:  [phone]   Type of Clearance Requested:   - Pharmacy:  Hold Apixaban  (Eliquis ) stop 2 days prior   Type of Anesthesia:  Not Indicated   Additional requests/questions:  Fax clearance status to the fax number above  Signed, Leita Lindbloom M Callin Ashe   11/22/2023, 9:08 AM

## 2023-11-22 NOTE — Telephone Encounter (Signed)
 Pharmacy please advise on holding Eliquis prior to colonoscopy scheduled for TBD. Thank you.

## 2023-11-23 NOTE — Telephone Encounter (Signed)
   Patient Name: Ronald Stokes  DOB: 05-Sep-1950 MRN: [id number]  Primary Cardiologist: Ralene Burger, MD  Clinical pharmacists have reviewed the patient's past medical history, labs, and current medications as part of preoperative protocol coverage. The following recommendations have been made:   Patient has not had an Afib/aflutter ablation within the last 3 months or DCCV within the last 30 days   Per office protocol, patient can hold Eliquis  for 2 days prior to procedure.     I will route this recommendation to the requesting party via Epic fax function and remove from pre-op pool.  Please call with questions.  Ava Boatman, NP 11/23/2023, 11:04 AM

## 2023-11-23 NOTE — Telephone Encounter (Signed)
 Patient with diagnosis of afib on Eliquis  for anticoagulation.    Procedure: Colonoscopy  Date of procedure: TBD   CHA2DS2-VASc Score = 4   This indicates a 4.8% annual risk of stroke. The patient's score is based upon: CHF History: 1 HTN History: 1 Diabetes History: 0 Stroke History: 0 Vascular Disease History: 1 Age Score: 1 Gender Score: 0      CrCl 50 ml/min Platelet count 168  Patient has not had an Afib/aflutter ablation within the last 3 months or DCCV within the last 30 days  Per office protocol, patient can hold Eliquis  for 2 days prior to procedure.    **This guidance is not considered finalized until pre-operative APP has relayed final recommendations.**

## 2023-12-15 DIAGNOSIS — R1011 Right upper quadrant pain: Secondary | ICD-10-CM | POA: Diagnosis not present

## 2023-12-15 DIAGNOSIS — M545 Low back pain, unspecified: Secondary | ICD-10-CM | POA: Diagnosis not present

## 2024-01-02 DIAGNOSIS — E039 Hypothyroidism, unspecified: Secondary | ICD-10-CM | POA: Diagnosis not present

## 2024-01-02 DIAGNOSIS — I456 Pre-excitation syndrome: Secondary | ICD-10-CM | POA: Diagnosis not present

## 2024-01-02 DIAGNOSIS — R0683 Snoring: Secondary | ICD-10-CM | POA: Diagnosis not present

## 2024-01-02 DIAGNOSIS — I422 Other hypertrophic cardiomyopathy: Secondary | ICD-10-CM | POA: Diagnosis not present

## 2024-01-02 DIAGNOSIS — I130 Hypertensive heart and chronic kidney disease with heart failure and stage 1 through stage 4 chronic kidney disease, or unspecified chronic kidney disease: Secondary | ICD-10-CM | POA: Diagnosis not present

## 2024-01-02 DIAGNOSIS — K219 Gastro-esophageal reflux disease without esophagitis: Secondary | ICD-10-CM | POA: Diagnosis not present

## 2024-01-02 DIAGNOSIS — E785 Hyperlipidemia, unspecified: Secondary | ICD-10-CM | POA: Diagnosis not present

## 2024-01-02 DIAGNOSIS — I509 Heart failure, unspecified: Secondary | ICD-10-CM | POA: Diagnosis not present

## 2024-01-02 DIAGNOSIS — D126 Benign neoplasm of colon, unspecified: Secondary | ICD-10-CM | POA: Diagnosis not present

## 2024-01-02 DIAGNOSIS — Z8673 Personal history of transient ischemic attack (TIA), and cerebral infarction without residual deficits: Secondary | ICD-10-CM | POA: Diagnosis not present

## 2024-01-02 DIAGNOSIS — Z79899 Other long term (current) drug therapy: Secondary | ICD-10-CM | POA: Diagnosis not present

## 2024-01-02 DIAGNOSIS — K635 Polyp of colon: Secondary | ICD-10-CM | POA: Diagnosis not present

## 2024-01-02 DIAGNOSIS — K648 Other hemorrhoids: Secondary | ICD-10-CM | POA: Diagnosis not present

## 2024-01-02 DIAGNOSIS — G43909 Migraine, unspecified, not intractable, without status migrainosus: Secondary | ICD-10-CM | POA: Diagnosis not present

## 2024-01-02 DIAGNOSIS — M1990 Unspecified osteoarthritis, unspecified site: Secondary | ICD-10-CM | POA: Diagnosis not present

## 2024-01-02 DIAGNOSIS — N1831 Chronic kidney disease, stage 3a: Secondary | ICD-10-CM | POA: Diagnosis not present

## 2024-01-02 DIAGNOSIS — K627 Radiation proctitis: Secondary | ICD-10-CM | POA: Diagnosis not present

## 2024-01-02 DIAGNOSIS — Z860100 Personal history of colon polyps, unspecified: Secondary | ICD-10-CM | POA: Diagnosis not present

## 2024-01-02 DIAGNOSIS — Z7901 Long term (current) use of anticoagulants: Secondary | ICD-10-CM | POA: Diagnosis not present

## 2024-01-02 DIAGNOSIS — I10 Essential (primary) hypertension: Secondary | ICD-10-CM | POA: Diagnosis not present

## 2024-01-02 DIAGNOSIS — Z87891 Personal history of nicotine dependence: Secondary | ICD-10-CM | POA: Diagnosis not present

## 2024-01-02 DIAGNOSIS — R55 Syncope and collapse: Secondary | ICD-10-CM | POA: Diagnosis not present

## 2024-01-02 DIAGNOSIS — D122 Benign neoplasm of ascending colon: Secondary | ICD-10-CM | POA: Diagnosis not present

## 2024-01-02 DIAGNOSIS — I4891 Unspecified atrial fibrillation: Secondary | ICD-10-CM | POA: Diagnosis not present

## 2024-01-02 DIAGNOSIS — K5730 Diverticulosis of large intestine without perforation or abscess without bleeding: Secondary | ICD-10-CM | POA: Diagnosis not present

## 2024-02-08 ENCOUNTER — Telehealth: Payer: Self-pay

## 2024-02-08 NOTE — Telephone Encounter (Signed)
   Pre-operative Risk Assessment    Patient Name: Ronald Stokes  DOB: [birth year] MRN: [id number]   Date of last office visit: 10/20/23 Date of next office visit: N/A  Request for Surgical Clearance    Procedure:  Dental Extraction - Amount of Teeth to be Pulled:  4  Date of Surgery:  Clearance TBD                                Surgeon:  TBD Surgeon's Group or Practice Name:  [facility] Phone number:  [phone] Fax number:  [phone]   Type of Clearance Requested:   - Medical    Type of Anesthesia:  Intravenous using fentanyl  and versed    Additional requests/questions:    Bonney Calvert Pouch   02/08/2024, 3:46 PM

## 2024-02-09 ENCOUNTER — Ambulatory Visit: Admitting: Cardiology

## 2024-02-09 NOTE — Telephone Encounter (Signed)
 Called Dr. Bari, DDS office for clarification for procedure. Left verbal message for a call back to confirm the information below.   4 extractions simple or surgical?  Eliquis  to be held yes or no?   Surgeon is Dr. Bari, DDS

## 2024-02-09 NOTE — Telephone Encounter (Signed)
 Pharmacy please advise on holding Eliquis  prior to  Dental Extraction - Amount of Teeth to be Pulled:  4 scheduled for TBD. Last labs, CBC and CMET (04/11/2023) Thank you.

## 2024-02-09 NOTE — Telephone Encounter (Signed)
 DDS office called back and left vm though did not leave a message about the questions our office has.   I called back and s/w Seirra, surgery scheduler who clarified needed information .   WILL NEED ELIQUIS  HOLD RECOMMENDATIONS   EXTRACTIONS; PLANNED FOR SURGICAL THOUGH WILL NOT KNOW UNTIL ACTUAL PROCEDURE.

## 2024-02-09 NOTE — Telephone Encounter (Signed)
 On review of medications, patient is on Eliquis .  Please clarify with dental surgeon about need to hold so that we can get clearance recommendations from pharmacy. [facility] Phone number:  [phone]

## 2024-02-13 DIAGNOSIS — H47323 Drusen of optic disc, bilateral: Secondary | ICD-10-CM | POA: Diagnosis not present

## 2024-02-13 DIAGNOSIS — H11041 Peripheral pterygium, stationary, right eye: Secondary | ICD-10-CM | POA: Diagnosis not present

## 2024-02-13 NOTE — Telephone Encounter (Signed)
 Primary Cardiologist:Robert Bernie, MD   Preoperative team, please contact this patient and set up a phone call appointment for further preoperative risk assessment. Please obtain consent and complete medication review. Thank you for your help.   I confirm that guidance regarding antiplatelet and oral anticoagulation therapy has been completed and, if necessary, noted below.  Patient does not require pre-op antibiotics for dental procedure. Per office protocol, patient can hold Eliquis  for 1 day prior to procedure.   I also confirmed the patient resides in the state of [place] . As per [facility] Medical Board telemedicine laws, the patient must reside in the state in which the provider is licensed.   Rosaline EMERSON Bane, NP-C  02/13/2024, 3:11 PM [address] [place], [place] [address] Office [phone] Fax [phone]

## 2024-02-13 NOTE — Telephone Encounter (Signed)
 1st attempt: Called patient, NA, left message to call back to schedule telehealth appointment.

## 2024-02-13 NOTE — Telephone Encounter (Signed)
 Patient with diagnosis of afib on Eliquis  for anticoagulation.    Procedure: Dental Extraction - Amount of Teeth to be Pulled: 4  Date of procedure: TBD   CHA2DS2-VASc Score = 4   This indicates a 4.8% annual risk of stroke. The patient's score is based upon: CHF History: 1 HTN History: 1 Diabetes History: 0 Stroke History: 0 Vascular Disease History: 1 Age Score: 1 Gender Score: 0      CrCl 50 ml/min Platelet count 168  Patient has not had an Afib/aflutter ablation within the last 3 months or DCCV within the last 30 days  Patient does not require pre-op antibiotics for dental procedure.  Per office protocol, patient can hold Eliquis  for 1 day prior to procedure.    **This guidance is not considered finalized until pre-operative APP has relayed final recommendations.**

## 2024-02-15 ENCOUNTER — Telehealth: Payer: Self-pay

## 2024-02-15 DIAGNOSIS — K219 Gastro-esophageal reflux disease without esophagitis: Secondary | ICD-10-CM | POA: Diagnosis not present

## 2024-02-15 NOTE — Telephone Encounter (Signed)
 Patient returned Pre-Op call.

## 2024-02-15 NOTE — Telephone Encounter (Signed)
 Appointment is scheduled for 02/26/2024 @ 9:40am. Med req and consent are complete.

## 2024-02-15 NOTE — Telephone Encounter (Signed)
2nd attempt to reach pt to schedule tele pre op appt.

## 2024-02-15 NOTE — Telephone Encounter (Signed)
  Patient Consent for Virtual Visit         Ronald Stokes has provided verbal consent on 02/15/2024 for a virtual visit (video or telephone).  Appointment is scheduled for 02/26/2024 @ 9:40am. Call patient at [phone]. Med req and consent are complete.    CONSENT FOR VIRTUAL VISIT FOR:  Ronald Stokes  By participating in this virtual visit I agree to the following:  I hereby voluntarily request, consent and authorize [facility] and its employed or contracted physicians, physician assistants, nurse practitioners or other licensed health care professionals (the Practitioner), to provide me with telemedicine health care services (the "Services) as deemed necessary by the treating Practitioner. I acknowledge and consent to receive the Services by the Practitioner via telemedicine. I understand that the telemedicine visit will involve communicating with the Practitioner through live audiovisual communication technology and the disclosure of certain medical information by electronic transmission. I acknowledge that I have been given the opportunity to request an in-person assessment or other available alternative prior to the telemedicine visit and am voluntarily participating in the telemedicine visit.  I understand that I have the right to withhold or withdraw my consent to the use of telemedicine in the course of my care at any time, without affecting my right to future care or treatment, and that the Practitioner or I may terminate the telemedicine visit at any time. I understand that I have the right to inspect all information obtained and/or recorded in the course of the telemedicine visit and may receive copies of available information for a reasonable fee.  I understand that some of the potential risks of receiving the Services via telemedicine include:  Delay or interruption in medical evaluation due to technological equipment failure or disruption; Information transmitted may not be  sufficient (e.g. poor resolution of images) to allow for appropriate medical decision making by the Practitioner; and/or  In rare instances, security protocols could fail, causing a breach of personal health information.  Furthermore, I acknowledge that it is my responsibility to provide information about my medical history, conditions and care that is complete and accurate to the best of my ability. I acknowledge that Practitioner's advice, recommendations, and/or decision may be based on factors not within their control, such as incomplete or inaccurate data provided by me or distortions of diagnostic images or specimens that may result from electronic transmissions. I understand that the practice of medicine is not an exact science and that Practitioner makes no warranties or guarantees regarding treatment outcomes. I acknowledge that a copy of this consent can be made available to me via my patient portal [facility] MyChart), or I can request a printed copy by calling the office of [facility].    I understand that my insurance will be billed for this visit.   I have read or had this consent read to me. I understand the contents of this consent, which adequately explains the benefits and risks of the Services being provided via telemedicine.  I have been provided ample opportunity to ask questions regarding this consent and the Services and have had my questions answered to my satisfaction. I give my informed consent for the services to be provided through the use of telemedicine in my medical care

## 2024-02-26 ENCOUNTER — Ambulatory Visit: Attending: Cardiovascular Disease

## 2024-02-26 DIAGNOSIS — Z01810 Encounter for preprocedural cardiovascular examination: Secondary | ICD-10-CM

## 2024-02-26 NOTE — Progress Notes (Signed)
 Virtual Visit via Telephone Note   Because of Ronald Stokes co-morbid illnesses, he is at least at moderate risk for complications without adequate follow up.  This format is felt to be most appropriate for this patient at this time.  Due to technical limitations with video connection (technology), today's appointment will be conducted as an audio only telehealth visit, and Ronald Stokes verbally agreed to proceed in this manner.   All issues noted in this document were discussed and addressed.  No physical exam could be performed with this format.  Evaluation Performed:  Preoperative cardiovascular risk assessment _____________   Date:  02/26/2024   Patient ID:  Ronald Stokes, DOB 05-19-1951, MRN [id number] Patient Location:  Home Provider location:   Office  Primary Care Provider:  Clemmie Nest, MD Primary Cardiologist:  Ronald Fitch, MD  Chief Complaint / Patient Profile   73 y.o. y/o male with a h/o paroxysmal atrial fibrillation, Wolff-Parkinson-White syndrome, GERD, hyperlipidemia who is pending dental extraction of 4 teeth and presents today for telephonic preoperative cardiovascular risk assessment.  History of Present Illness    Ronald Stokes is a 73 y.o. male who presents via audio/video conferencing for a telehealth visit today.  Pt was last seen in cardiology clinic on 10/20/2023 by Dr. Fitch.  At that time Ronald Stokes was doing well .  The patient is now pending procedure as outlined above. Since his last visit, he remains stable from a cardiac standpoint.  Today he denies chest pain, shortness of breath, lower extremity edema, fatigue, palpitations, melena, hematuria, hemoptysis, diaphoresis, weakness, presyncope, syncope, orthopnea, and PND.   Past Medical History    Past Medical History:  Diagnosis Date   Apical variant hypertrophic cardiomyopathy (HCC) 02/09/2015   Atrial fibrillation (HCC)    Atypical chest pain 04/18/2018   CKD (chronic kidney disease)     Essential hypertension 02/09/2015   GERD (gastroesophageal reflux disease)    History of hypertrophic cardiomyopathy 04/18/2018   Hyperlipidemia    Hypothyroidism    On amiodarone  therapy 02/09/2015   PAF (paroxysmal atrial fibrillation) (HCC) 02/09/2015   Paroxysmal atrial fibrillation (HCC) 04/18/2018   Personal history of radiation therapy 04/18/2018   Prostate cancer (HCC) 04/18/2018   Status post ablation of atrial fibrillation 04/18/2018   Syncope    WPW (Wolff-Parkinson-White syndrome)    Past Surgical History:  Procedure Laterality Date   ATRIAL FIBRILLATION ABLATION N/A 04/27/2023   Procedure: ATRIAL FIBRILLATION ABLATION;  Surgeon: Ronald Soyla Lunger, MD;  Location: [facility] INVASIVE CV LAB;  Service: Cardiovascular;  Laterality: N/A;   COLONOSCOPY  05/28/2014   Colonic polyp status post polypectomy. Moderate sigmoid diverticulosis. Limited examination due to quality of preparation. \   ESOPHAGOGASTRODUODENOSCOPY  12/18/2014   Normal EGD.    HERNIA REPAIR     ROTATOR CUFF REPAIR     TRANSESOPHAGEAL ECHOCARDIOGRAM (CATH LAB) N/A 04/27/2023   Procedure: TRANSESOPHAGEAL ECHOCARDIOGRAM;  Surgeon: Ronald Soyla Lunger, MD;  Location: [facility] INVASIVE CV LAB;  Service: Cardiovascular;  Laterality: N/A;   WPW corrective surgery      Allergies  No Known Allergies  Home Medications    Prior to Admission medications   Medication Sig Start Date End Date Taking? Authorizing Provider  acetaminophen  (TYLENOL ) 500 MG tablet Take 500 mg by mouth every 8 (eight) hours as needed for mild pain or moderate pain.    [provider]  apixaban  (ELIQUIS ) 5 MG TABS tablet TAKE ONE TABLET BY MOUTH EVERY MORNING and TAKE ONE TABLET BY MOUTH EVERY  EVENING 11/08/23   Ronald Ronald PARAS, MD  atorvastatin  (LIPITOR) 40 MG tablet Take 1 tablet (40 mg total) by mouth at bedtime. 04/10/23   Krasowski, Robert J, MD  ezetimibe  (ZETIA ) 10 MG tablet Take 1 tablet (10 mg total) by mouth daily. 08/18/23   Ronald Redell PARAS, MD  LEVOTHYROXINE  SODIUM PO Take 1 tablet by mouth daily. Unsure of strength    [provider]  metoprolol  succinate (TOPROL  XL) 25 MG 24 hr tablet Take 1 tablet (25 mg total) by mouth daily. 10/20/23   Ronald Ronald PARAS, MD  OVER THE COUNTER MEDICATION Take 1 capsule by mouth daily. Ultra Male    [provider]  OVER THE COUNTER MEDICATION Take 1 capsule by mouth daily. Sight Care    [provider]  OVER THE COUNTER MEDICATION Take 1 tablet by mouth daily. Boostaro    [provider]  zolpidem (AMBIEN) 10 MG tablet Take 10 mg by mouth at bedtime. 01/21/22   [provider]    Physical Exam    Vital Signs:  Ronald Stokes does not have vital signs available for review today.  Given telephonic nature of communication, physical exam is limited. AAOx3. NAD. Normal affect.  Speech and respirations are unlabored.  Accessory Clinical Findings    None  Assessment & Plan    1.  Preoperative Cardiovascular Risk Assessment: Dental extraction of 4 teeth,[facility] Phone number:  [phone] Fax number:  [phone]    Primary Cardiologist: Ronald Bernie, MD  Chart reviewed as part of pre-operative protocol coverage. Given past medical history and time since last visit, based on ACC/AHA guidelines, Ronald Stokes would be at acceptable risk for the planned procedure without further cardiovascular testing.     Patient was advised that if he develops new symptoms prior to surgery to contact our office to arrange a follow-up appointment.  He verbalized understanding.  Patient does not require pre-op antibiotics for dental procedure. Per office protocol, patient can hold Eliquis  for 1 day prior to procedure.  I will route this recommendation to the requesting party via Epic fax function and remove from pre-op pool.       Time:   Today, I have spent 5 minutes with the patient with telehealth technology  discussing medical history, symptoms, and management plan.  I spent 10 minutes reviewing patient's past cardiac history and cardiac medications.    Ronald CHRISTELLA Beauvais, NP  02/26/2024, 7:04 AM

## 2024-03-18 ENCOUNTER — Ambulatory Visit: Attending: Cardiology | Admitting: Cardiology

## 2024-03-18 ENCOUNTER — Encounter: Payer: Self-pay | Admitting: Cardiology

## 2024-03-18 VITALS — BP 120/80 | HR 65 | Ht 69.0 in | Wt 180.0 lb

## 2024-03-18 DIAGNOSIS — Z9889 Other specified postprocedural states: Secondary | ICD-10-CM | POA: Diagnosis not present

## 2024-03-18 DIAGNOSIS — Z8679 Personal history of other diseases of the circulatory system: Secondary | ICD-10-CM | POA: Diagnosis not present

## 2024-03-18 DIAGNOSIS — I456 Pre-excitation syndrome: Secondary | ICD-10-CM | POA: Diagnosis not present

## 2024-03-18 DIAGNOSIS — I422 Other hypertrophic cardiomyopathy: Secondary | ICD-10-CM | POA: Diagnosis not present

## 2024-03-18 DIAGNOSIS — I480 Paroxysmal atrial fibrillation: Secondary | ICD-10-CM | POA: Diagnosis not present

## 2024-03-18 MED ORDER — METOPROLOL SUCCINATE ER 25 MG PO TB24: 25.0000 mg | ORAL_TABLET | Freq: Every day | ORAL | 3 refills | Status: AC

## 2024-03-18 MED ORDER — EZETIMIBE 10 MG PO TABS: 10.0000 mg | ORAL_TABLET | Freq: Every day | ORAL | 3 refills | Status: AC

## 2024-03-18 MED ORDER — ATORVASTATIN CALCIUM 40 MG PO TABS: 40.0000 mg | ORAL_TABLET | Freq: Every day | ORAL | 3 refills | Status: DC

## 2024-03-18 NOTE — Patient Instructions (Addendum)
 Medication Instructions:   Restart: Lipitor 40mg  daily  Restart: Metoprolol  25mg  daily   Lab Work: None Ordered If you have labs (blood work) drawn today and your tests are completely normal, you will receive your results only by: MyChart Message (if you have MyChart) OR A paper copy in the mail If you have any lab test that is abnormal or we need to change your treatment, we will call you to review the results.   Testing/Procedures: None Ordered   Follow-Up: At [facility], you and your health needs are our priority.  As part of our continuing mission to provide you with exceptional heart care, we have created designated Provider Care Teams.  These Care Teams include your primary Cardiologist (physician) and Advanced Practice Providers (APPs -  Physician Assistants and Nurse Practitioners) who all work together to provide you with the care you need, when you need it.  We recommend signing up for the patient portal called MyChart.  Sign up information is provided on this After Visit Summary.  MyChart is used to connect with patients for Virtual Visits (Telemedicine).  Patients are able to view lab/test results, encounter notes, upcoming appointments, etc.  Non-urgent messages can be sent to your provider as well.   To learn more about what you can do with MyChart, go to [email or web address].    Your next appointment:   6 month(s)  The format for your next appointment:   In Person  Provider:   Lamar Fitch, MD    Other Instructions NA

## 2024-03-18 NOTE — Progress Notes (Unsigned)
 Cardiology Office Note:    Date:  03/18/2024   ID:  Ronald Stokes, DOB 27-Jan-1951, MRN [id number]  PCP:  Clemmie Nest, MD  Cardiologist:  Lamar Fitch, MD    Referring MD: Clemmie Nest, MD   Chief Complaint  Patient presents with   Follow-up    History of Present Illness:    Ronald Stokes is a 73 y.o. male past medical history significant for hypertrophic cardiomyopathy apical variant diagnosed 7 years ago, history of WPW, status post ablation, paroxysmal atrial fibrillation, status post ablation, dyslipidemia, prostrate CA, anticoagulated.  Comes today to months for follow-up he is now doing well mentally.  He said that he is depressed likely does not have any suicidal idealization no homicidal idealization, I talked in length with him about it I strongly recommend to see look for some help look for a psychiatrist or maybe psychologist but he said he is not doing because does not help.  We had a long discussion about it.  I did recommend to start being able be more active which can help with depression as well.  He said his son 2 weeks ago ended up having myocardial infarction and was managed by seen at [facility].  He denies have any chest pain tightness squeezing pressure chest no palpitations dizziness passing out he did stop some of his medications  Past Medical History:  Diagnosis Date   Apical variant hypertrophic cardiomyopathy (HCC) 02/09/2015   Atrial fibrillation (HCC)    Atypical chest pain 04/18/2018   CKD (chronic kidney disease)    Essential hypertension 02/09/2015   GERD (gastroesophageal reflux disease)    History of hypertrophic cardiomyopathy 04/18/2018   Hyperlipidemia    Hypothyroidism    On amiodarone  therapy 02/09/2015   PAF (paroxysmal atrial fibrillation) (HCC) 02/09/2015   Paroxysmal atrial fibrillation (HCC) 04/18/2018   Personal history of radiation therapy 04/18/2018   Prostate cancer (HCC) 04/18/2018   Status post ablation of atrial fibrillation  04/18/2018   Syncope    WPW (Wolff-Parkinson-White syndrome)     Past Surgical History:  Procedure Laterality Date   ATRIAL FIBRILLATION ABLATION N/A 04/27/2023   Procedure: ATRIAL FIBRILLATION ABLATION;  Surgeon: Inocencio Soyla Lunger, MD;  Location: [facility] INVASIVE CV LAB;  Service: Cardiovascular;  Laterality: N/A;   COLONOSCOPY  05/28/2014   Colonic polyp status post polypectomy. Moderate sigmoid diverticulosis. Limited examination due to quality of preparation. \   ESOPHAGOGASTRODUODENOSCOPY  12/18/2014   Normal EGD.    HERNIA REPAIR     ROTATOR CUFF REPAIR     TRANSESOPHAGEAL ECHOCARDIOGRAM (CATH LAB) N/A 04/27/2023   Procedure: TRANSESOPHAGEAL ECHOCARDIOGRAM;  Surgeon: Inocencio Soyla Lunger, MD;  Location: [facility] INVASIVE CV LAB;  Service: Cardiovascular;  Laterality: N/A;   WPW corrective surgery      Current Medications: Current Meds  Medication Sig   acetaminophen  (TYLENOL ) 500 MG tablet Take 500 mg by mouth every 8 (eight) hours as needed for mild pain or moderate pain.   apixaban  (ELIQUIS ) 5 MG TABS tablet TAKE ONE TABLET BY MOUTH EVERY MORNING and TAKE ONE TABLET BY MOUTH EVERY EVENING   LEVOTHYROXINE  SODIUM PO Take 1 tablet by mouth daily. Unsure of strength   OVER THE COUNTER MEDICATION Take 1 capsule by mouth daily. Ultra Male   zolpidem (AMBIEN) 10 MG tablet Take 10 mg by mouth at bedtime.     Allergies:   Patient has no known allergies.   Social History   Socioeconomic History   Marital status: Married    Spouse  name: Not on file   Number of children: Not on file   Years of education: Not on file   Highest education level: Not on file  Occupational History   Not on file  Tobacco Use   Smoking status: Former    Types: Cigars   Smokeless tobacco: Former   Tobacco comments:    Former smoker 05/25/23  Vaping Use   Vaping status: Never Used  Substance and Sexual Activity   Alcohol use: Yes    Comment: ocassionally, beer once in a while   Drug use: Never   Sexual  activity: Not on file  Other Topics Concern   Not on file  Social History Narrative   Not on file   Social Drivers of Health   Financial Resource Strain: Not on file  Food Insecurity: Not on file  Transportation Needs: Not on file  Physical Activity: Not on file  Stress: Not on file  Social Connections: Not on file     Family History: The patient's family history includes Heart disease in his father. There is no history of Colon cancer or Esophageal cancer. ROS:   Please see the history of present illness.    All 14 point review of systems negative except as described per history of present illness  EKGs/Labs/Other Studies Reviewed:         Recent Labs: 04/11/2023: BUN 20; Hemoglobin 15.1; Platelets 168; Potassium 4.4; Sodium 144 08/11/2023: ALT 20 10/26/2023: Creatinine, Ser 1.70  Recent Lipid Panel    Component Value Date/Time   CHOL 228 (H) 08/11/2023 0839   TRIG 307 (H) 08/11/2023 0839   HDL 35 (L) 08/11/2023 0839   CHOLHDL 6.5 (H) 08/11/2023 0839   LDLCALC 137 (H) 08/11/2023 0839    Physical Exam:    VS:  BP 120/80   Pulse 65   Ht 5' 9 (1.753 m)   Wt 180 lb (81.6 kg)   SpO2 97%   BMI 26.58 kg/m     Wt Readings from Last 3 Encounters:  03/18/24 180 lb (81.6 kg)  10/20/23 183 lb 3.2 oz (83.1 kg)  08/15/23 185 lb (83.9 kg)     GEN:  Well nourished, well developed in no acute distress HEENT: Normal NECK: No JVD; No carotid bruits LYMPHATICS: No lymphadenopathy CARDIAC: RRR, no murmurs, no rubs, no gallops RESPIRATORY:  Clear to auscultation without rales, wheezing or rhonchi  ABDOMEN: Soft, non-tender, non-distended MUSCULOSKELETAL:  No edema; No deformity  SKIN: Warm and dry LOWER EXTREMITIES: no swelling NEUROLOGIC:  Alert and oriented x 3 PSYCHIATRIC:  Normal affect   ASSESSMENT:    1. Apical variant hypertrophic cardiomyopathy (HCC)   2. PAF (paroxysmal atrial fibrillation) (HCC)   3. WPW (Wolff-Parkinson-White syndrome)   4. Status post  ablation of atrial fibrillation    PLAN:    In order of problems listed above:  Apical variant hypertrophic cardiomyopathy likely asymptomatic.  Continue monitoring continue appropriate medications. Paroxysmal atrial fibrillation no palpitations, continue anticoagulation, history of WPW status post ablation no recurrences continue monitoring. Status post ablation of atrial fibrillation stable no recurrences. Depression and its I think the problem I strongly recommended to look for help I did talk to him about better help into any solution for it but he said he is not interested in that.  I encouraged him to be [place] more active which can help with depression as well.  Will restart all medications   Medication Adjustments/Labs and Tests Ordered: Current medicines are reviewed at length with  the patient today.  Concerns regarding medicines are outlined above.  No orders of the defined types were placed in this encounter.  Medication changes: No orders of the defined types were placed in this encounter.   Signed, Lamar DOROTHA Fitch, MD, [facility] 03/18/2024 10:08 AM    [facility]

## 2024-03-20 ENCOUNTER — Telehealth: Payer: Self-pay | Admitting: Cardiology

## 2024-03-20 NOTE — Telephone Encounter (Signed)
 Pt calling stating that Dr. Bernie stated for him to start taking medication amiodarone  again at last office visit. This is not in Dr. Bernie office note. Please address

## 2024-03-20 NOTE — Telephone Encounter (Signed)
 Patient came in office today stating that Dr. Bernie told him to start back taking amiodarone  but it was never sent to [facility] and patient needs a refill.

## 2024-03-22 ENCOUNTER — Telehealth: Payer: Self-pay

## 2024-03-22 MED ORDER — AMIODARONE HCL 200 MG PO TABS
200.0000 mg | ORAL_TABLET | Freq: Every day | ORAL | 3 refills | Status: AC
Start: 2024-03-22 — End: ?

## 2024-03-22 NOTE — Telephone Encounter (Signed)
 Pt notified to restart Amiodarone  200mg  q d

## 2024-03-22 NOTE — Addendum Note (Signed)
 Addended by: ARLOA PLANAS D on: 03/22/2024 05:17 PM   Modules accepted: Orders

## 2024-03-22 NOTE — Telephone Encounter (Signed)
 Per Dr. Krasowski start Amiodarone  200mg  daily

## 2024-03-22 NOTE — Telephone Encounter (Signed)
 Left message for the patient to call back.

## 2024-04-09 DIAGNOSIS — N1831 Chronic kidney disease, stage 3a: Secondary | ICD-10-CM | POA: Diagnosis not present

## 2024-04-18 DIAGNOSIS — I480 Paroxysmal atrial fibrillation: Secondary | ICD-10-CM | POA: Diagnosis not present

## 2024-04-18 DIAGNOSIS — Z720 Tobacco use: Secondary | ICD-10-CM | POA: Diagnosis not present

## 2024-04-18 DIAGNOSIS — N1831 Chronic kidney disease, stage 3a: Secondary | ICD-10-CM | POA: Diagnosis not present

## 2024-05-13 ENCOUNTER — Other Ambulatory Visit: Payer: Self-pay | Admitting: Cardiology

## 2024-05-13 DIAGNOSIS — I480 Paroxysmal atrial fibrillation: Secondary | ICD-10-CM

## 2024-05-13 NOTE — Telephone Encounter (Signed)
 Eliquis  5mg  refill request received. Patient is 73 years old, weight-81.6kg, Crea-1.70 on 10/26/23, Diagnosis-Afib, and last seen by Dr. Bernie on 03/18/24. Dose is appropriate based on dosing criteria. Will send in refill to requested pharmacy.
